# Patient Record
Sex: Female | Born: 1955 | Race: White | Hispanic: No | Marital: Single | Smoking: Never smoker
Health system: Southern US, Community
[De-identification: ages and names within clinical notes are randomized; demographics above are authoritative.]

## PROBLEM LIST (undated history)

## (undated) DIAGNOSIS — R42 Dizziness and giddiness: Secondary | ICD-10-CM

## (undated) DIAGNOSIS — K219 Gastro-esophageal reflux disease without esophagitis: Secondary | ICD-10-CM

## (undated) DIAGNOSIS — Z9889 Other specified postprocedural states: Secondary | ICD-10-CM

## (undated) DIAGNOSIS — T4145XA Adverse effect of unspecified anesthetic, initial encounter: Secondary | ICD-10-CM

## (undated) DIAGNOSIS — Z8709 Personal history of other diseases of the respiratory system: Secondary | ICD-10-CM

## (undated) DIAGNOSIS — J45909 Unspecified asthma, uncomplicated: Secondary | ICD-10-CM

## (undated) DIAGNOSIS — E785 Hyperlipidemia, unspecified: Secondary | ICD-10-CM

## (undated) DIAGNOSIS — K625 Hemorrhage of anus and rectum: Secondary | ICD-10-CM

## (undated) DIAGNOSIS — D649 Anemia, unspecified: Secondary | ICD-10-CM

## (undated) DIAGNOSIS — T8859XA Other complications of anesthesia, initial encounter: Secondary | ICD-10-CM

## (undated) DIAGNOSIS — E079 Disorder of thyroid, unspecified: Secondary | ICD-10-CM

## (undated) DIAGNOSIS — Z9289 Personal history of other medical treatment: Secondary | ICD-10-CM

## (undated) DIAGNOSIS — R112 Nausea with vomiting, unspecified: Secondary | ICD-10-CM

## (undated) HISTORY — PX: CHOLECYSTECTOMY: SHX55

## (undated) HISTORY — DX: Gastro-esophageal reflux disease without esophagitis: K21.9

## (undated) HISTORY — PX: OTHER SURGICAL HISTORY: SHX169

## (undated) HISTORY — DX: Hyperlipidemia, unspecified: E78.5

## (undated) HISTORY — PX: TONSILLECTOMY: SUR1361

---

## 2008-12-16 ENCOUNTER — Ambulatory Visit (HOSPITAL_COMMUNITY): Admission: RE | Admit: 2008-12-16 | Discharge: 2008-12-16 | Payer: Self-pay | Admitting: Specialist

## 2011-01-27 LAB — CBC
Platelets: 245 10*3/uL (ref 150–400)
RDW: 12.7 % (ref 11.5–15.5)
WBC: 6.7 10*3/uL (ref 4.0–10.5)

## 2011-01-27 LAB — BASIC METABOLIC PANEL
BUN: 11 mg/dL (ref 6–23)
Calcium: 9.4 mg/dL (ref 8.4–10.5)
Creatinine, Ser: 0.73 mg/dL (ref 0.4–1.2)
GFR calc non Af Amer: >60 mL/min (ref >60)
Glucose, Bld: 95 mg/dL (ref 70–99)

## 2011-03-01 NOTE — Op Note (Signed)
NAMECHRISMA, HURLOCK                  ACCOUNT NO.:  [id number]   MEDICAL RECORD NO.:  [id number]          PATIENT TYPE:  AMB   LOCATION:  DAY                          FACILITY:  [facility]   PHYSICIAN:  Jene Every, M.D.    DATE OF BIRTH:  Jun 25, 1956   DATE OF PROCEDURE:  12/16/2008  DATE OF DISCHARGE:                               OPERATIVE REPORT   PREOPERATIVE DIAGNOSIS:  Impingement syndrome, acromioclavicular  arthrosis, right shoulder.   POSTOPERATIVE DIAGNOSIS:  Impingement syndrome, acromioclavicular  arthrosis, right shoulder, anterior labral tear.   PROCEDURES PERFORMED:  1. Examination and manipulation under anesthesia, right shoulder.  2. Right shoulder arthroscopy and debridement of anterior labrum.  3. Subacromial decompression, acromioplasty, bursectomy, and resection      of coracoacromial ligament.  4. Mini open distal clavicle resection.   BRIEF HISTORY AND INDICATION:  A 55 year old with refractory shoulder  pain.  Temporary relief from an [facility] joint injection and subacromial  corticosteroid injection.  MRI indicating no tear, severe AC arthrosis,  and impingement.  Operative intervention is indicated for distal  clavicle resection, subacromial decompression, bursectomy, and  evaluation of the humeral joint.  Risks and benefits discussed,  including bleeding, infection, damage to neurovascular structures, no  change in symptoms, worsening symptoms, need for repeat debridement,  etc.   TECHNIQUE:  The patient in supine beach-chair position, after induction  of adequate anesthesia, 1 gram of Kefzol, the right shoulder and upper  extremity were prepped and draped in the usual sterile fashion.  Range  of motion was full noted on exam.   Surgical marker was utilized to delineate the acromion, AC joint, and  coracoid.  Standard posterolateral, anterolateral, and anterior portal  incisions were made through the skin only with a #11 blade.  With the  arm in the 70/30  position with gentle traction, the arthroscopic cannula  was then inserted into the glenohumeral joint, penetrating it  atraumatically in line with the coracoid.  Under direct visualization,  an anterior portal cannula was introduced into the glenohumeral space  under direct visualization beneath the biceps tendon.   Next, examination of the glenohumeral joint revealed normal glenoid and  humerus.  There was an anterior labral tear noted and some synovitis of  the biceps tendon.  A shaver was introduced and utilized to debride the  labrum to a stable base.  There was no detachment of the labrum.  Subscap was unremarkable.  We shaved the undersurface of the biceps  tendon.  No significant tearing was noted.  No tear in the rotator cuff  was noted either.   Next, the anterior cannula was transposed to the lateral portal and the  arthroscopic camera was then inserted into the subacromial space  posteriorly and by triangulation we observed hypertrophic bursa and  bursitis in the subacromial space and synovitis.  We introduced a shaver  and utilized it to perform a full bursectomy.  We then released and  morselized the CA ligament, which improved our subacromial space.  This  was done with an ArthroWand.  We shaved the anterolateral aspect of  the  acromion with a small spur.  We were unable to delivered the clavicle  into the subacromial space due to this significant arthrosis.   Next, full examination of the rotator cuff revealed no evidence of  tearing from full inspection and palpation.  After full bursectomy, we  then removed all arthroscopic equipment and closed the portals with 4-0  nylon simple sutures and injected Marcaine with epinephrine into the  subacromial joint.   Next, a small incision was made over the clavicle.  Subcutaneous tissue  was dissected with cautery as well to achieve hemostasis.  We split the  capsule and the deltotrapezial fascia, skeletonized the distal  clavicle,  and removed the distal centimeter, protecting the soft tissue anteriorly  and posteriorly and the rotator cuff tendon with small retractors.  Following this, we undercut the clavicle with a 3-mm Kerrison.  The  rotator cuff was unremarkable.  We irrigated and debrided the joint.  There was severe arthrosis noted just prior to the resection.  Bone wax  was placed over the distal clavicle.  We irrigated as well and closed  the capsule with 0 Vicryl interrupted figure-of-eight sutures, the  deltotrapezial fascia and the subcutaneous tissue with 2-0 Vicryl simple  sutures.  Skin was reapproximated with 4-0 subcuticular Prolene.  The  clavicle was stable following its distal resection to manipulation.   Next, the wound was dressed sterilely.  She was placed in a sling,  extubated without difficulty, and transported to the recovery room in  satisfactory condition.   The patient tolerated the procedure with no complications.   ASSISTANT:  Roma Schanz, P.A.      Jene Every, M.D.  Electronically Signed     JB/MEDQ  D:  12/16/2008  T:  12/17/2008  Job:  [id number]

## 2013-12-13 ENCOUNTER — Emergency Department (HOSPITAL_COMMUNITY): Payer: Worker's Compensation

## 2013-12-13 ENCOUNTER — Encounter (HOSPITAL_COMMUNITY): Payer: Self-pay | Admitting: Emergency Medicine

## 2013-12-13 ENCOUNTER — Emergency Department (HOSPITAL_COMMUNITY)
Admission: EM | Admit: 2013-12-13 | Discharge: 2013-12-13 | Disposition: A | Discharge status: Home / Self Care | Payer: Worker's Compensation | Attending: Emergency Medicine | Admitting: Emergency Medicine

## 2013-12-13 DIAGNOSIS — Z862 Personal history of diseases of the blood and blood-forming organs and certain disorders involving the immune mechanism: Secondary | ICD-10-CM | POA: Insufficient documentation

## 2013-12-13 DIAGNOSIS — Z8639 Personal history of other endocrine, nutritional and metabolic disease: Secondary | ICD-10-CM | POA: Insufficient documentation

## 2013-12-13 DIAGNOSIS — S4980XA Other specified injuries of shoulder and upper arm, unspecified arm, initial encounter: Secondary | ICD-10-CM | POA: Insufficient documentation

## 2013-12-13 DIAGNOSIS — Y9301 Activity, walking, marching and hiking: Secondary | ICD-10-CM | POA: Diagnosis not present

## 2013-12-13 DIAGNOSIS — S46909A Unspecified injury of unspecified muscle, fascia and tendon at shoulder and upper arm level, unspecified arm, initial encounter: Secondary | ICD-10-CM | POA: Insufficient documentation

## 2013-12-13 DIAGNOSIS — Y929 Unspecified place or not applicable: Secondary | ICD-10-CM | POA: Diagnosis not present

## 2013-12-13 DIAGNOSIS — W010XXA Fall on same level from slipping, tripping and stumbling without subsequent striking against object, initial encounter: Secondary | ICD-10-CM | POA: Diagnosis not present

## 2013-12-13 DIAGNOSIS — W19XXXA Unspecified fall, initial encounter: Secondary | ICD-10-CM

## 2013-12-13 DIAGNOSIS — M25511 Pain in right shoulder: Secondary | ICD-10-CM

## 2013-12-13 HISTORY — DX: Dizziness and giddiness: R42

## 2013-12-13 HISTORY — DX: Disorder of thyroid, unspecified: E07.9

## 2013-12-13 MED ORDER — FENTANYL CITRATE 0.05 MG/ML IJ SOLN
100.0000 ug | Freq: Once | INTRAMUSCULAR | Status: AC
  Administered 2013-12-13: 100 ug via INTRAMUSCULAR
  Filled 2013-12-13: qty 2

## 2013-12-13 MED ORDER — ONDANSETRON 4 MG PO TBDP
8.0000 mg | ORAL_TABLET | Freq: Once | ORAL | Status: AC
  Administered 2013-12-13: 8 mg via ORAL
  Filled 2013-12-13: qty 2

## 2013-12-13 MED ORDER — HYDROCODONE-ACETAMINOPHEN 5-325 MG PO TABS: 2.0000 | ORAL_TABLET | Freq: Four times a day (QID) | ORAL | Status: DC | PRN

## 2013-12-13 MED ORDER — ONDANSETRON HCL 4 MG PO TABS: 4.0000 mg | ORAL_TABLET | Freq: Four times a day (QID) | ORAL | Status: DC

## 2013-12-13 MED ORDER — OXYCODONE-ACETAMINOPHEN 5-325 MG PO TABS: 2.0000 | ORAL_TABLET | Freq: Four times a day (QID) | ORAL | Status: DC | PRN

## 2013-12-13 MED ORDER — TRAMADOL HCL 50 MG PO TABS: 50.0000 mg | ORAL_TABLET | Freq: Four times a day (QID) | ORAL | Status: DC | PRN

## 2013-12-13 NOTE — ED Notes (Signed)
Per pt sts that she fell on some ice this am and is having severe right shoulder pain with very limited movement.

## 2013-12-13 NOTE — Discharge Instructions (Signed)
Fall Prevention and Home Safety Falls cause injuries and can affect all age groups. It is possible to prevent falls.  HOW TO PREVENT FALLS  Wear shoes with rubber soles that do not have an opening for your toes.  Keep the inside and outside of your house well lit.  Use night lights throughout your home.  Remove clutter from floors.  Clean up floor spills.  Remove throw rugs or fasten them to the floor with carpet tape.  Do not place electrical cords across pathways.  Put grab bars by your tub, shower, and toilet. Do not use towel bars as grab bars.  Put handrails on both sides of the stairway. Fix loose handrails.  Do not climb on stools or stepladders, if possible.  Do not wax your floors.  Repair uneven or unsafe sidewalks, walkways, or stairs.  Keep items you use a lot within reach.  Be aware of pets.  Keep emergency numbers next to the telephone.  Put smoke detectors in your home and near bedrooms. Ask your doctor what other things you can do to prevent falls. Document Released: 07/30/2009 Document Revised: 04/03/2012 Document Reviewed: 01/03/2012 [facility] Patient Information 2014 [place], [place].  Shoulder Pain The shoulder is the joint that connects your arm to your body. Muscles and band-like tissues that connect bones to muscles (tendons) hold the joint together. Shoulder pain is felt if an injury or medical problem affects one or more parts of the shoulder. HOME CARE   Put ice on the sore area.  Put ice in a plastic bag.  Place a towel between your skin and the bag.  Leave the ice on for 15-20 minutes, 03-04 times a day for the first 2 days.  Stop using cold packs if they do not help with the pain.  If you were given something to keep your shoulder from moving (sling, shoulder immobilizer), wear it as told. Only take it off to shower or bathe.  Move your arm as little as possible, but keep your hand moving to prevent puffiness (swelling).  Squeeze a  soft ball or foam pad as much as possible to help prevent swelling.  Take medicine as told by your doctor. GET HELP RIGHT AWAY IF:   Your arm, hand, or fingers are numb or tingling.  Your arm, hand, or fingers are puffy (swollen), painful, or turn white or blue.  You have more pain.  You have progressing new pain in your arm, hand, or fingers.  Your hand or fingers get cold.  Your medicine does not help lessen your pain. MAKE SURE YOU:   Understand these instructions.  Will watch your condition.  Will get help right away if you are not doing well or get worse. Document Released: 03/21/2008 Document Revised: 06/27/2012 Document Reviewed: 04/16/2012 [facility] Patient Information 2014 [place], [place].  Shoulder Range of Motion Exercises The shoulder is the most flexible joint in the human body. Because of this it is also the most unstable joint in the body. All ages can develop shoulder problems. Early treatment of problems is necessary for a good outcome. People react to shoulder pain by decreasing the movement of the joint. After a brief period of time, the shoulder can become "frozen". This is an almost complete loss of the ability to move the damaged shoulder. Following injuries your caregivers can give you instructions on exercises to keep your range of motion (ability to move your shoulder freely), or regain it if it has been lost.  EXERCISES EXERCISES TO  MAINTAIN THE MOBILITY OF YOUR SHOULDER: Codman's Exercise or Pendulum Exercise  This exercise may be performed in a prone (face-down) lying position or standing while leaning on a chair with the opposite arm. Its purpose is to relax the muscles in your shoulder and slowly but surely increase the range of motion and to relieve pain.  Lie on your stomach close to the side edge of the bed. Let your weak arm hang over the edge of the bed. Relax your shoulder, arm and hand. Let your shoulder blade relax and drop down.  Slowly and  gently swing your arm forward and back. Do not use your neck muscles; relax them. It might be easier to have someone else gently start swinging your arm.  As pain decreases, increase your swing. To start, arm swing should begin at 15 degree angles. In time and as pain lessens, move to 30-45 degree angles. Start with swinging for about 15 seconds, and work towards swinging for 3 to 5 minutes.  This exercise may also be performed in a standing/bent over position.  Stand and hold onto a sturdy chair with your good arm. Bend forward at the waist and bend your knees slightly to help protect your back. Relax your weak arm, let it hang limp. Relax your shoulder blade and let it drop.  Keep your shoulder relaxed and use body motion to swing your arm in small circles.  Stand up tall and relax.  Repeat motion and change direction of circles.  Start with swinging for about 30 seconds, and work towards swinging for 3 to 5 minutes. STRETCHING EXERCISES:  Lift your arm out in front of you with the elbow bent at 90 degrees. Using your other arm gently pull the elbow forward and across your body.  Bend one arm behind you with the palm facing outward. Using the other arm, hold a towel or rope and reach this arm up above your head, then bend it at the elbow to move your wrist to behind your neck. Grab the free end of the towel with the hand behind your back. Gently pull the towel up with the hand behind your neck, gradually increasing the pull on the hand behind the small of your back. Then, gradually pull down with the hand behind the small of your back. This will pull the hand and arm behind your neck further. Both shoulders will have an increased range of motion with repetition of this exercise. STRENGTHENING EXERCISES:  Standing with your arm at your side and straight out from your shoulder with the elbow bent at 90 degrees, hold onto a small weight and slowly raise your hand so it points straight up in the  air. Repeat this five times to begin with, and gradually increase to ten times. Do this four times per day. As you grow stronger you can gradually increase the weight.  Repeat the above exercise, only this time using an elastic band. Start with your hand up in the air and pull down until your hand is by your side. As you grow stronger, gradually increase the amount you pull by increasing the number or size of the elastic bands. Use the same amount of repetitions.  Standing with your hand at your side and holding onto a weight, gradually lift the hand in front of you until it is over your head. Do the same also with the hand remaining at your side and lift the hand away from your body until it is again over your  head. Repeat this five times to begin with, and gradually increase to ten times. Do this four times per day. As you grow stronger you can gradually increase the weight. Document Released: 07/02/2003 Document Revised: 12/26/2011 Document Reviewed: 10/03/2005 [facility] Patient Information 2014 [place].

## 2013-12-13 NOTE — ED Provider Notes (Signed)
CSN: [id number]     Arrival date & time 12/13/13  1057 History   None    Chief Complaint  Patient presents with  . Shoulder Pain     (Consider location/radiation/quality/duration/timing/severity/associated sxs/prior Treatment) HPI Comments: Patient is a 58 year old female with history of thyroid disease and vertigo who presents today after a fall this morning. She was walking on the ice this morning going to see her first patient when she fell on black ice on an outstretched hand as well as her right shoulder. She is having severe, 10/10 pain. She is reluctant to move her shoulder. She had previous arthroscopy done on this shoulder 4-5 years ago for arthritis. Dr. Tonita Cong did this surgery, but she does not want to go back to see him. She did not hit her head or sustain any other injuries in the fall. She has been ambulatory. No fevers, chills, nausea, vomiting, shortness of breath, chest pain, headache.   The history is provided by the patient. No language interpreter was used.    Past Medical History  Diagnosis Date  . Thyroid disease   . Vertigo    Past Surgical History  Procedure Laterality Date  . Cholecystectomy     History reviewed. No pertinent family history. History  Substance Use Topics  . Smoking status: Never Smoker   . Smokeless tobacco: Not on file  . Alcohol Use: Not on file   OB History   Grav Para Term Preterm Abortions TAB SAB Ect Mult Living                 Review of Systems  Constitutional: Negative for fever and chills.  Respiratory: Negative for shortness of breath.   Cardiovascular: Negative for chest pain.  Gastrointestinal: Negative for nausea, vomiting and abdominal pain.  Musculoskeletal: Positive for arthralgias, joint swelling and myalgias.  Neurological: Negative for headaches.  All other systems reviewed and are negative.      Allergies  Asa; Codeine; and Grapefruit concentrate  Home Medications  No current outpatient prescriptions on  file. BP 178/71  Pulse 62  Temp(Src) 97.8 F (36.6 C)  Resp 18  Ht 5\' 4"  (1.626 m)  Wt 165 lb (74.844 kg)  BMI 28.31 kg/m2  SpO2 96% Physical Exam  Nursing note and vitals reviewed. Constitutional: She is oriented to person, place, and time. She appears well-developed and well-nourished. She does not appear ill. No distress.  HENT:  Head: Normocephalic and atraumatic.  Right Ear: External ear normal.  Left Ear: External ear normal.  Nose: Nose normal.  Mouth/Throat: Oropharynx is clear and moist.  Eyes: Conjunctivae and EOM are normal. Pupils are equal, round, and reactive to light.  Neck: Normal range of motion.  Cardiovascular: Normal rate, regular rhythm, normal heart sounds, intact distal pulses and normal pulses.   Pulses:      Radial pulses are 2+ on the right side, and 2+ on the left side.  Pulmonary/Chest: Effort normal and breath sounds normal. No stridor. No respiratory distress. She has no wheezes. She has no rales.  Abdominal: Soft. She exhibits no distension.  Musculoskeletal: Normal range of motion.  TTP over right shoulder diffusely. No deformity. No bruising. TTP over distal clavicle and AC joint. Neurovascularly intact. Compartment soft.  No anatomical snuff box tenderness.   Neurological: She is alert and oriented to person, place, and time. She has normal strength.  Skin: Skin is warm and dry. She is not diaphoretic. No erythema.  Psychiatric: She has a normal  mood and affect. Her behavior is normal.    ED Course  Procedures (including critical care time) Labs Review Labs Reviewed - No data to display Imaging Review Dg Shoulder Right  12/13/2013   CLINICAL DATA:  Shoulder pain status post fall  EXAM: RIGHT SHOULDER - 2+ VIEW  COMPARISON:  DG SHOULDER 2+V*R* dated 05/22/2008  FINDINGS: Two views of the right shoulder reveal the bones to be adequately mineralized. There is no evidence of an acute fracture nor dislocation. The observed portions of the right  clavicle and upper right ribs appear normal.  IMPRESSION: There is no acute bony abnormality of the right shoulder.   Electronically Signed   By: David  Martinique   On: 12/13/2013 12:02   Dg Hand Complete Right  12/13/2013   CLINICAL DATA:  Right hand numbness status post fall  EXAM: RIGHT HAND - COMPLETE 3+ VIEW  COMPARISON:  None.  FINDINGS: Three views of the right hand reveal the bones to be mildly osteopenic. There is no evidence of an acute fracture nor dislocation. Minimal degenerative interphalangeal joint changes are present. There is mild degenerative change of the first carpometacarpal joint. The distal radius and ulna and the carpal bones are grossly normal.  IMPRESSION: There are mild degenerative changes involving principally the interphalangeal joints and the first carpometacarpal joint. There is no evidence of an acute fracture nor dislocation.   Electronically Signed   By: David  Martinique   On: 12/13/2013 12:03    @NOMUSE @  MDM   Final diagnoses:  Fall  Right shoulder pain    Patient presents to ED with right shoulder pain after [place]. Pain in right shoulder and mild pain in hand. No other injuries. No LOC, confusion, disorientation. XR hand and shoulder are negative for acute pathology. Patient is diffusely tender to palpation over right shoulder. No anatomic snuffbox tenderness. Neurovascularly intact. Compartment soft. Patient placed in a sling for comfort and given work note. She was given orthopedic follow up if pain does not subside. Return instructions given. Vital signs stable for discharge. Patient / Family / Caregiver informed of clinical course, understand medical decision-making process, and agree with plan.     Elwyn Lade, PA-C 12/13/13 1227

## 2013-12-13 NOTE — ED Provider Notes (Signed)
Medical screening examination/treatment/procedure(s) were performed by non-physician practitioner and as supervising physician I was immediately available for consultation/collaboration.  Crisol Muecke L Bobetta Korf, MD 12/13/13 2123 

## 2016-06-17 ENCOUNTER — Ambulatory Visit: Payer: Self-pay | Admitting: Orthopedic Surgery

## 2016-06-22 ENCOUNTER — Encounter (HOSPITAL_COMMUNITY): Payer: Self-pay

## 2016-06-22 ENCOUNTER — Encounter (HOSPITAL_COMMUNITY)
Admission: RE | Admit: 2016-06-22 | Discharge: 2016-06-22 | Disposition: A | Discharge status: Home / Self Care | Payer: BLUE CROSS/BLUE SHIELD | Source: Ambulatory Visit | Attending: Orthopedic Surgery | Admitting: Orthopedic Surgery

## 2016-06-22 ENCOUNTER — Ambulatory Visit: Payer: Self-pay | Admitting: Orthopedic Surgery

## 2016-06-22 DIAGNOSIS — M1712 Unilateral primary osteoarthritis, left knee: Secondary | ICD-10-CM | POA: Insufficient documentation

## 2016-06-22 DIAGNOSIS — Z01812 Encounter for preprocedural laboratory examination: Secondary | ICD-10-CM | POA: Insufficient documentation

## 2016-06-22 HISTORY — DX: Anemia, unspecified: D64.9

## 2016-06-22 HISTORY — DX: Other specified postprocedural states: R11.2

## 2016-06-22 HISTORY — DX: Hemorrhage of anus and rectum: K62.5

## 2016-06-22 HISTORY — DX: Personal history of other diseases of the respiratory system: Z87.09

## 2016-06-22 HISTORY — DX: Personal history of other medical treatment: Z92.89

## 2016-06-22 HISTORY — DX: Other complications of anesthesia, initial encounter: T88.59XA

## 2016-06-22 HISTORY — DX: Other specified postprocedural states: Z98.890

## 2016-06-22 HISTORY — DX: Unspecified asthma, uncomplicated: J45.909

## 2016-06-22 HISTORY — DX: Adverse effect of unspecified anesthetic, initial encounter: T41.45XA

## 2016-06-22 LAB — URINE MICROSCOPIC-ADD ON

## 2016-06-22 LAB — URINALYSIS, ROUTINE W REFLEX MICROSCOPIC
BILIRUBIN URINE: NEGATIVE
GLUCOSE, UA: NEGATIVE mg/dL
KETONES UR: NEGATIVE mg/dL
Leukocytes, UA: NEGATIVE
Nitrite: NEGATIVE
PH: 5 (ref 5.0–8.0)
Protein, ur: NEGATIVE mg/dL
SPECIFIC GRAVITY, URINE: 1.029 (ref 1.005–1.030)

## 2016-06-22 LAB — SURGICAL PCR SCREEN
MRSA, PCR: NEGATIVE
STAPHYLOCOCCUS AUREUS: NEGATIVE

## 2016-06-22 LAB — PROTIME-INR
INR: 0.97
PROTHROMBIN TIME: 12.9 s (ref 11.4–15.2)

## 2016-06-22 LAB — ABO/RH: ABO/RH(D): A POS

## 2016-06-22 LAB — APTT: APTT: 27 s (ref 24–36)

## 2016-06-22 NOTE — Progress Notes (Signed)
CMP and [facility] lab results per chart 06/15/2016 EKG per chart 06/13/2016

## 2016-06-22 NOTE — Patient Instructions (Signed)
Diana Scott  06/22/2016   Your procedure is scheduled on: Wednesday June 29, 2016  Report to [facility]  Entrance take [place]  elevators to 3rd floor to  [place] at 9:45 AM.  Call this number if you have problems the morning of surgery [phone]   Remember: ONLY 1 PERSON MAY GO WITH YOU TO SHORT STAY TO GET  READY MORNING OF [place].  Do not eat food or drink liquids :After Midnight.     Take these medicines the morning of surgery with A SIP OF WATER: Levothyroxine                                You may not have any metal on your body including hair pins and              piercings  Do not wear jewelry, make-up, lotions, powders or perfumes, deodorant             Do not wear nail polish.  Do not shave  48 hours prior to surgery.           Do not bring valuables to the hospital. [place].  Contacts, dentures or bridgework may not be worn into surgery.  Leave suitcase in the car. After surgery it may be brought to your room.                Please read over the following fact sheets you were given:MRSA INFORMATION SHEET; INCENTIVE SPIROMETER; BLOOD TRANSFUSION INFORMATION SHEET  _____________________________________________________________________             [facility] - Preparing for Surgery Before surgery, you can play an important role.  Because skin is not sterile, your skin needs to be as free of germs as possible.  You can reduce the number of germs on your skin by washing with CHG (chlorahexidine gluconate) soap before surgery.  CHG is an antiseptic cleaner which kills germs and bonds with the skin to continue killing germs even after washing. Please DO NOT use if you have an allergy to CHG or antibacterial soaps.  If your skin becomes reddened/irritated stop using the CHG and inform your nurse when you arrive at Short Stay. Do not shave (including legs and underarms) for at  least 48 hours prior to the first CHG shower.  You may shave your face/neck. Please follow these instructions carefully:  1.  Shower with CHG Soap the night before surgery and the  morning of Surgery.  2.  If you choose to wash your hair, wash your hair first as usual with your  normal  shampoo.  3.  After you shampoo, rinse your hair and body thoroughly to remove the  shampoo.                           4.  Use CHG as you would any other liquid soap.  You can apply chg directly  to the skin and wash                       Gently with a scrungie or clean washcloth.  5.  Apply the CHG Soap to  your body ONLY FROM THE NECK DOWN.   Do not use on face/ open                           Wound or open sores. Avoid contact with eyes, ears mouth and genitals (private parts).                       Wash face,  Genitals (private parts) with your normal soap.             6.  Wash thoroughly, paying special attention to the area where your surgery  will be performed.  7.  Thoroughly rinse your body with warm water from the neck down.  8.  DO NOT shower/wash with your normal soap after using and rinsing off  the CHG Soap.                9.  Pat yourself dry with a clean towel.            10.  Wear clean pajamas.            11.  Place clean sheets on your bed the night of your first shower and do not  sleep with pets. Day of Surgery : Do not apply any lotions/deodorants the morning of surgery.  Please wear clean clothes to the hospital/surgery center.  FAILURE TO FOLLOW THESE INSTRUCTIONS MAY RESULT IN THE CANCELLATION OF YOUR SURGERY PATIENT SIGNATURE_________________________________  NURSE SIGNATURE__________________________________  ________________________________________________________________________   Adam Phenix  An incentive spirometer is a tool that can help keep your lungs clear and active. This tool measures how well you are filling your lungs with each breath. Taking long deep breaths  may help reverse or decrease the chance of developing breathing (pulmonary) problems (especially infection) following:  A long period of time when you are unable to move or be active. BEFORE THE PROCEDURE   If the spirometer includes an indicator to show your best effort, your nurse or respiratory therapist will set it to a desired goal.  If possible, sit up straight or lean slightly forward. Try not to slouch.  Hold the incentive spirometer in an upright position. INSTRUCTIONS FOR USE  1. Sit on the edge of your bed if possible, or sit up as far as you can in bed or on a chair. 2. Hold the incentive spirometer in an upright position. 3. Breathe out normally. 4. Place the mouthpiece in your mouth and seal your lips tightly around it. 5. Breathe in slowly and as deeply as possible, raising the piston or the ball toward the top of the column. 6. Hold your breath for 3-5 seconds or for as long as possible. Allow the piston or ball to fall to the bottom of the column. 7. Remove the mouthpiece from your mouth and breathe out normally. 8. Rest for a few seconds and repeat Steps 1 through 7 at least 10 times every 1-2 hours when you are awake. Take your time and take a few normal breaths between deep breaths. 9. The spirometer may include an indicator to show your best effort. Use the indicator as a goal to work toward during each repetition. 10. After each set of 10 deep breaths, practice coughing to be sure your lungs are clear. If you have an incision (the cut made at the time of surgery), support your incision when coughing by placing a pillow or rolled up towels firmly against  it. Once you are able to get out of bed, walk around indoors and cough well. You may stop using the incentive spirometer when instructed by your caregiver.  RISKS AND COMPLICATIONS  Take your time so you do not get dizzy or light-headed.  If you are in pain, you may need to take or ask for pain medication before doing  incentive spirometry. It is harder to take a deep breath if you are having pain. AFTER USE  Rest and breathe slowly and easily.  It can be helpful to keep track of a log of your progress. Your caregiver can provide you with a simple table to help with this. If you are using the spirometer at home, follow these instructions: [place] IF:   You are having difficultly using the spirometer.  You have trouble using the spirometer as often as instructed.  Your pain medication is not giving enough relief while using the spirometer.  You develop fever of 100.5 F (38.1 C) or higher. SEEK IMMEDIATE MEDICAL CARE IF:   You cough up bloody sputum that had not been present before.  You develop fever of 102 F (38.9 C) or greater.  You develop worsening pain at or near the incision site. MAKE SURE YOU:   Understand these instructions.  Will watch your condition.  Will get help right away if you are not doing well or get worse. Document Released: 02/13/2007 Document Revised: 12/26/2011 Document Reviewed: 04/16/2007 ExitCare Patient Information 2014 Exit[employer], [place].   ________________________________________________________________________  WHAT IS A BLOOD TRANSFUSION? Blood Transfusion Information  A transfusion is the replacement of blood or some of its parts. Blood is made up of multiple cells which provide different functions.  Red blood cells carry oxygen and are used for blood loss replacement.  White blood cells fight against infection.  Platelets control bleeding.  Plasma helps clot blood.  Other blood products are available for specialized needs, such as hemophilia or other clotting disorders. BEFORE THE TRANSFUSION  Who gives blood for transfusions?   Healthy volunteers who are fully evaluated to make sure their blood is safe. This is blood bank blood. Transfusion therapy is the safest it has ever been in the practice of medicine. Before blood is taken from a  donor, a complete history is taken to make sure that person has no history of diseases nor engages in risky social behavior (examples are intravenous drug use or sexual activity with multiple partners). The donor's travel history is screened to minimize risk of transmitting infections, such as malaria. The donated blood is tested for signs of infectious diseases, such as HIV and hepatitis. The blood is then tested to be sure it is compatible with you in order to minimize the chance of a transfusion reaction. If you or a relative donates blood, this is often done in anticipation of surgery and is not appropriate for emergency situations. It takes many days to process the donated blood. RISKS AND COMPLICATIONS Although transfusion therapy is very safe and saves many lives, the main dangers of transfusion include:   Getting an infectious disease.  Developing a transfusion reaction. This is an allergic reaction to something in the blood you were given. Every precaution is taken to prevent this. The decision to have a blood transfusion has been considered carefully by your caregiver before blood is given. Blood is not given unless the benefits outweigh the risks. AFTER THE TRANSFUSION  Right after receiving a blood transfusion, you will usually feel much better and more  energetic. This is especially true if your red blood cells have gotten low (anemic). The transfusion raises the level of the red blood cells which carry oxygen, and this usually causes an energy increase.  The nurse administering the transfusion will monitor you carefully for complications. HOME CARE INSTRUCTIONS  No special instructions are needed after a transfusion. You may find your energy is better. Speak with your caregiver about any limitations on activity for underlying diseases you may have. SEEK MEDICAL CARE IF:   Your condition is not improving after your transfusion.  You develop redness or irritation at the intravenous (IV)  site. SEEK IMMEDIATE MEDICAL CARE IF:  Any of the following symptoms occur over the next 12 hours:  Shaking chills.  You have a temperature by mouth above 102 F (38.9 C), not controlled by medicine.  Chest, back, or muscle pain.  People around you feel you are not acting correctly or are confused.  Shortness of breath or difficulty breathing.  Dizziness and fainting.  You get a rash or develop hives.  You have a decrease in urine output.  Your urine turns a dark color or changes to pink, red, or brown. Any of the following symptoms occur over the next 10 days:  You have a temperature by mouth above 102 F (38.9 C), not controlled by medicine.  Shortness of breath.  Weakness after normal activity.  The white part of the eye turns yellow (jaundice).  You have a decrease in the amount of urine or are urinating less often.  Your urine turns a dark color or changes to pink, red, or brown. Document Released: 09/30/2000 Document Revised: 12/26/2011 Document Reviewed: 05/19/2008 [facility] Patient Information 2014 [place], [place].  _______________________________________________________________________

## 2016-06-22 NOTE — Progress Notes (Signed)
Urinalysis and micro results in epic per PAT visit 06/22/2016 sent to Dr Wynelle Link

## 2016-06-27 NOTE — H&P (Signed)
TOTAL KNEE ADMISSION H&P  Patient is being admitted for left total knee arthroplasty.  Subjective:  Chief Complaint:left knee pain.  HPI: Diana Scott, 60 y.o. female, has a history of pain and functional disability in the left knee due to arthritis and has failed non-surgical conservative treatments for greater than 12 weeks to includeNSAID's and/or analgesics, corticosteriod injections, flexibility and strengthening excercises and activity modification.  Onset of symptoms was gradual, starting 4 years ago with gradually worsening course since that time. The patient noted prior procedures on the knee to include  arthroscopy and menisectomy on the left knee(s).  Patient currently rates pain in the left knee(s) at 8 out of 10 with activity. Patient has night pain, worsening of pain with activity and weight bearing, pain that interferes with activities of daily living, pain with passive range of motion, crepitus and joint swelling.  Patient has evidence of periarticular osteophytes and joint space narrowing by imaging studies. There is no active infection.   Past Medical History:  Diagnosis Date  . Anemia   . Asthma    childhood   . Complication of anesthesia    sensitive to anesthesia; vertigo episode after anesthesia; nausea and vomiting   . History of blood transfusion   . History of bronchitis   . PONV (postoperative nausea and vomiting)   . Rectal bleeding   . Thyroid disease   . Vertigo     Past Surgical History:  Procedure Laterality Date  . CHOLECYSTECTOMY    . colonscopy      polyp removed   . TONSILLECTOMY        Current Outpatient Prescriptions:  .  CRESTOR 10 MG tablet, Take 5 mg by mouth See admin instructions. Pt takes for 3 days and 3 days off. This is to help tolerate med., Disp: , Rfl:  .  ibandronate (BONIVA) 150 MG tablet, Take 1 tablet by mouth every 30 (thirty) days., Disp: , Rfl:  .  levothyroxine (SYNTHROID, LEVOTHROID) 88 MCG tablet, Take 88 mcg by mouth  daily., Disp: , Rfl:  .  meloxicam (MOBIC) 15 MG tablet, Take 7.5 mg by mouth daily as needed for pain. , Disp: , Rfl:  .  Omega-3 Fatty Acids (FISH OIL) 1000 MG CAPS, Take 1 capsule by mouth daily., Disp: , Rfl:  .  pyridoxine (B-6) 100 MG tablet, Take 100 mg by mouth daily., Disp: , Rfl:  .  vitamin B-12 (CYANOCOBALAMIN) 500 MCG tablet, Take 1,000 mcg by mouth daily., Disp: , Rfl:  .  vitamin C (ASCORBIC ACID) 500 MG tablet, Take 500 mg by mouth daily., Disp: , Rfl:   Allergies  Allergen Reactions  . Oxycodone Nausea Only  . Asa [Aspirin] Other (See Comments)    Reaction unknown  . Codeine Other (See Comments)    Reaction unknown Hydrocodone, Oxycodone   . Grapefruit Concentrate Other (See Comments)    Reaction unknown    Social History  Substance Use Topics  . Smoking status: Never Smoker  . Smokeless tobacco: Never Used  . Alcohol use No      Review of Systems  Constitutional: Negative.   HENT: Negative.   Respiratory: Negative.   Cardiovascular: Positive for orthopnea. Negative for chest pain, palpitations, claudication, leg swelling and PND.  Gastrointestinal: Negative.   Genitourinary: Negative.   Musculoskeletal: Positive for joint pain and myalgias. Negative for back pain, falls and neck pain.  Skin: Negative.   Neurological: Negative.   Endo/Heme/Allergies: Negative.   Psychiatric/Behavioral: Negative.  Objective:  Physical Exam  Constitutional: She is oriented to person, place, and time. She appears well-developed. No distress.  Overweight  HENT:  Head: Normocephalic and atraumatic.  Right Ear: External ear normal.  Left Ear: External ear normal.  Nose: Nose normal.  Mouth/Throat: Oropharynx is clear and moist.  Eyes: Conjunctivae and EOM are normal.  Neck: Normal range of motion. Neck supple.  Cardiovascular: Normal rate, regular rhythm, normal heart sounds and intact distal pulses.   No murmur heard. Respiratory: Breath sounds normal. No  respiratory distress. She has no wheezes.  GI: Soft. Bowel sounds are normal. She exhibits no distension. There is no tenderness.  Musculoskeletal:       Right hip: Normal.       Left hip: Normal.       Right knee: Normal.       Left knee: She exhibits decreased range of motion and swelling. She exhibits no effusion and no erythema. Tenderness found. Medial joint line and lateral joint line tenderness noted.  Both hips showed normal range of motion with no discomfort. Her right knee shows no effusion. Range of motion in right knee is about 0 to 135. Minimal crepitus with range of motion. No tenderness or instability. Left knee, trace effusion. Range about 0 to 125. Marked crepitus on range of motion, tenderness medial greater than lateral with no instability noted. There is no deformity. She does have an antalgic gait pattern on the left.  Neurological: She is alert and oriented to person, place, and time. She has normal strength. No sensory deficit.  Skin: No rash noted. She is not diaphoretic. No erythema.  Psychiatric: She has a normal mood and affect. Her behavior is normal.     Vitals  Weight: 161 lb Height: 63in Body Surface Area: 1.76 m Body Mass Index: 28.52 kg/m  Pulse: 76 (Regular)  BP: 142/84 (Sitting, Left Arm, Standard)   Imaging Review Plain radiographs demonstrate severe degenerative joint disease of the left knee(s). The overall alignment ismild varus. The bone quality appears to be good for age and reported activity level.  Assessment/Plan:  End stage primary osteoarthritis, left knee   The patient history, physical examination, clinical judgment of the provider and imaging studies are consistent with end stage degenerative joint disease of the left knee(s) and total knee arthroplasty is deemed medically necessary. The treatment options including medical management, injection therapy arthroscopy and arthroplasty were discussed at length. The risks and benefits  of total knee arthroplasty were presented and reviewed. The risks due to aseptic loosening, infection, stiffness, patella tracking problems, thromboembolic complications and other imponderables were discussed. The patient acknowledged the explanation, agreed to proceed with the plan and consent was signed. Patient is being admitted for inpatient treatment for surgery, pain control, PT, OT, prophylactic antibiotics, VTE prophylaxis, progressive ambulation and ADL's and discharge planning. The patient is planning to be discharged home with home health services   PCP: Dr. Kathlen Mody Therapy Plans: HHPT then outpatient therapy Home with boyfriend    Ardeen Jourdain, [place]

## 2016-06-29 ENCOUNTER — Inpatient Hospital Stay (HOSPITAL_COMMUNITY)
Admission: RE | Admit: 2016-06-29 | Discharge: 2016-06-30 | DRG: 470 | Disposition: A | Discharge status: Home Health | Payer: BLUE CROSS/BLUE SHIELD | Source: Ambulatory Visit | Attending: Orthopedic Surgery | Admitting: Orthopedic Surgery

## 2016-06-29 ENCOUNTER — Inpatient Hospital Stay (HOSPITAL_COMMUNITY): Payer: BLUE CROSS/BLUE SHIELD | Admitting: Anesthesiology

## 2016-06-29 ENCOUNTER — Encounter (HOSPITAL_COMMUNITY)
Admission: RE | Disposition: A | Discharge status: Home Health | Payer: Self-pay | Source: Ambulatory Visit | Attending: Orthopedic Surgery

## 2016-06-29 ENCOUNTER — Encounter (HOSPITAL_COMMUNITY): Payer: Self-pay | Admitting: *Deleted

## 2016-06-29 DIAGNOSIS — Z885 Allergy status to narcotic agent status: Secondary | ICD-10-CM

## 2016-06-29 DIAGNOSIS — Z91018 Allergy to other foods: Secondary | ICD-10-CM | POA: Diagnosis not present

## 2016-06-29 DIAGNOSIS — Z791 Long term (current) use of non-steroidal anti-inflammatories (NSAID): Secondary | ICD-10-CM | POA: Diagnosis not present

## 2016-06-29 DIAGNOSIS — M25562 Pain in left knee: Secondary | ICD-10-CM | POA: Diagnosis present

## 2016-06-29 DIAGNOSIS — M179 Osteoarthritis of knee, unspecified: Secondary | ICD-10-CM | POA: Diagnosis present

## 2016-06-29 DIAGNOSIS — M1710 Unilateral primary osteoarthritis, unspecified knee: Secondary | ICD-10-CM | POA: Diagnosis present

## 2016-06-29 DIAGNOSIS — Z886 Allergy status to analgesic agent status: Secondary | ICD-10-CM

## 2016-06-29 DIAGNOSIS — M25762 Osteophyte, left knee: Secondary | ICD-10-CM | POA: Diagnosis present

## 2016-06-29 DIAGNOSIS — Z79899 Other long term (current) drug therapy: Secondary | ICD-10-CM

## 2016-06-29 DIAGNOSIS — M1712 Unilateral primary osteoarthritis, left knee: Principal | ICD-10-CM | POA: Diagnosis present

## 2016-06-29 HISTORY — PX: TOTAL KNEE ARTHROPLASTY: SHX125

## 2016-06-29 LAB — TYPE AND SCREEN
ABO/RH(D): A POS
ANTIBODY SCREEN: NEGATIVE

## 2016-06-29 SURGERY — ARTHROPLASTY, KNEE, TOTAL
Anesthesia: General | Site: Hip | Laterality: Left

## 2016-06-29 MED ORDER — TRANEXAMIC ACID 1000 MG/10ML IV SOLN
1000.0000 mg | INTRAVENOUS | Status: AC
  Administered 2016-06-29: 1000 mg via INTRAVENOUS
  Filled 2016-06-29: qty 1100

## 2016-06-29 MED ORDER — ACETAMINOPHEN 10 MG/ML IV SOLN
INTRAVENOUS | Status: AC
  Filled 2016-06-29: qty 100

## 2016-06-29 MED ORDER — LACTATED RINGERS IV SOLN
INTRAVENOUS | Status: DC
  Administered 2016-06-29 (×2): via INTRAVENOUS

## 2016-06-29 MED ORDER — PROPOFOL 10 MG/ML IV BOLUS
INTRAVENOUS | Status: AC
  Filled 2016-06-29: qty 40

## 2016-06-29 MED ORDER — FLEET ENEMA 7-19 GM/118ML RE ENEM: 1.0000 | ENEMA | Freq: Once | RECTAL | Status: DC | PRN

## 2016-06-29 MED ORDER — DEXAMETHASONE SODIUM PHOSPHATE 10 MG/ML IJ SOLN
10.0000 mg | Freq: Once | INTRAMUSCULAR | Status: AC
  Administered 2016-06-29: 10 mg via INTRAVENOUS

## 2016-06-29 MED ORDER — BUPIVACAINE IN DEXTROSE 0.75-8.25 % IT SOLN
INTRATHECAL | Status: DC | PRN
  Administered 2016-06-29: 2 mL via INTRATHECAL

## 2016-06-29 MED ORDER — HYDROMORPHONE HCL 1 MG/ML IJ SOLN
INTRAMUSCULAR | Status: AC
  Administered 2016-06-29: 0.5 mg via INTRAVENOUS
  Filled 2016-06-29: qty 1

## 2016-06-29 MED ORDER — METOCLOPRAMIDE HCL 5 MG PO TABS
5.0000 mg | ORAL_TABLET | Freq: Three times a day (TID) | ORAL | Status: DC | PRN
  Administered 2016-06-30: 5 mg via ORAL
  Filled 2016-06-29: qty 1

## 2016-06-29 MED ORDER — TRAMADOL HCL 50 MG PO TABS: 50.0000 mg | ORAL_TABLET | Freq: Four times a day (QID) | ORAL | Status: DC | PRN

## 2016-06-29 MED ORDER — STERILE WATER FOR IRRIGATION IR SOLN
Status: DC | PRN
  Administered 2016-06-29: 2000 mL

## 2016-06-29 MED ORDER — ONDANSETRON HCL 4 MG/2ML IJ SOLN
INTRAMUSCULAR | Status: AC
  Filled 2016-06-29: qty 2

## 2016-06-29 MED ORDER — FENTANYL CITRATE (PF) 100 MCG/2ML IJ SOLN
INTRAMUSCULAR | Status: AC
  Filled 2016-06-29: qty 2

## 2016-06-29 MED ORDER — BUPIVACAINE HCL (PF) 0.25 % IJ SOLN
INTRAMUSCULAR | Status: AC
  Filled 2016-06-29: qty 30

## 2016-06-29 MED ORDER — MEPERIDINE HCL 50 MG/ML IJ SOLN: 6.2500 mg | INTRAMUSCULAR | Status: DC | PRN

## 2016-06-29 MED ORDER — METOCLOPRAMIDE HCL 5 MG/ML IJ SOLN
5.0000 mg | Freq: Three times a day (TID) | INTRAMUSCULAR | Status: DC | PRN
  Administered 2016-06-29: 10 mg via INTRAVENOUS
  Filled 2016-06-29: qty 2

## 2016-06-29 MED ORDER — LEVOTHYROXINE SODIUM 88 MCG PO TABS
88.0000 ug | ORAL_TABLET | Freq: Every day | ORAL | Status: DC
  Administered 2016-06-30: 88 ug via ORAL
  Filled 2016-06-29: qty 1

## 2016-06-29 MED ORDER — ACETAMINOPHEN 10 MG/ML IV SOLN
1000.0000 mg | Freq: Once | INTRAVENOUS | Status: AC
  Administered 2016-06-29: 1000 mg via INTRAVENOUS
  Filled 2016-06-29: qty 100

## 2016-06-29 MED ORDER — ONDANSETRON HCL 4 MG/2ML IJ SOLN
4.0000 mg | Freq: Four times a day (QID) | INTRAMUSCULAR | Status: DC | PRN
  Filled 2016-06-29: qty 2

## 2016-06-29 MED ORDER — DOCUSATE SODIUM 100 MG PO CAPS
100.0000 mg | ORAL_CAPSULE | Freq: Two times a day (BID) | ORAL | Status: DC
  Administered 2016-06-29 – 2016-06-30 (×2): 100 mg via ORAL
  Filled 2016-06-29 (×3): qty 1

## 2016-06-29 MED ORDER — ONDANSETRON HCL 4 MG/2ML IJ SOLN
INTRAMUSCULAR | Status: DC | PRN
  Administered 2016-06-29: 4 mg via INTRAVENOUS

## 2016-06-29 MED ORDER — ACETAMINOPHEN 325 MG PO TABS: 650.0000 mg | ORAL_TABLET | Freq: Four times a day (QID) | ORAL | Status: DC | PRN

## 2016-06-29 MED ORDER — METHOCARBAMOL 500 MG PO TABS
500.0000 mg | ORAL_TABLET | Freq: Four times a day (QID) | ORAL | Status: DC | PRN
  Administered 2016-06-29: 500 mg via ORAL
  Filled 2016-06-29 (×3): qty 1

## 2016-06-29 MED ORDER — PROPOFOL 500 MG/50ML IV EMUL
INTRAVENOUS | Status: DC | PRN
  Administered 2016-06-29: 100 ug/kg/min via INTRAVENOUS

## 2016-06-29 MED ORDER — HYDROMORPHONE HCL 2 MG PO TABS
2.0000 mg | ORAL_TABLET | ORAL | Status: DC | PRN
  Administered 2016-06-30 (×2): 2 mg via ORAL
  Filled 2016-06-29: qty 2
  Filled 2016-06-29 (×2): qty 1

## 2016-06-29 MED ORDER — LACTATED RINGERS IV SOLN: INTRAVENOUS | Status: DC

## 2016-06-29 MED ORDER — CHLORHEXIDINE GLUCONATE 4 % EX LIQD
60.0000 mL | Freq: Once | CUTANEOUS | Status: DC
Start: 2016-06-29 — End: 2016-06-29

## 2016-06-29 MED ORDER — TRANEXAMIC ACID 1000 MG/10ML IV SOLN
1000.0000 mg | Freq: Once | INTRAVENOUS | Status: AC
  Administered 2016-06-29: 1000 mg via INTRAVENOUS
  Filled 2016-06-29: qty 10

## 2016-06-29 MED ORDER — MIDAZOLAM HCL 2 MG/2ML IJ SOLN
INTRAMUSCULAR | Status: AC
  Filled 2016-06-29: qty 2

## 2016-06-29 MED ORDER — PROPOFOL 10 MG/ML IV BOLUS
INTRAVENOUS | Status: DC | PRN
  Administered 2016-06-29: 140 mg via INTRAVENOUS

## 2016-06-29 MED ORDER — ACETAMINOPHEN 650 MG RE SUPP: 650.0000 mg | Freq: Four times a day (QID) | RECTAL | Status: DC | PRN

## 2016-06-29 MED ORDER — HYDROMORPHONE HCL 1 MG/ML IJ SOLN
0.2500 mg | INTRAMUSCULAR | Status: DC | PRN
  Administered 2016-06-29: 0.25 mg via INTRAVENOUS
  Administered 2016-06-29: 0.5 mg via INTRAVENOUS

## 2016-06-29 MED ORDER — METHOCARBAMOL 1000 MG/10ML IJ SOLN
500.0000 mg | Freq: Four times a day (QID) | INTRAMUSCULAR | Status: DC | PRN
  Administered 2016-06-29: 500 mg via INTRAVENOUS
  Filled 2016-06-29: qty 5
  Filled 2016-06-29: qty 550

## 2016-06-29 MED ORDER — ONDANSETRON HCL 4 MG/2ML IJ SOLN: 4.0000 mg | Freq: Once | INTRAMUSCULAR | Status: DC | PRN

## 2016-06-29 MED ORDER — SODIUM CHLORIDE 0.9 % IJ SOLN
INTRAMUSCULAR | Status: DC | PRN
  Administered 2016-06-29: 30 mL

## 2016-06-29 MED ORDER — ONDANSETRON HCL 4 MG PO TABS
4.0000 mg | ORAL_TABLET | Freq: Four times a day (QID) | ORAL | Status: DC | PRN
  Administered 2016-06-30 (×2): 4 mg via ORAL
  Filled 2016-06-29 (×2): qty 1

## 2016-06-29 MED ORDER — DEXAMETHASONE SODIUM PHOSPHATE 10 MG/ML IJ SOLN
INTRAMUSCULAR | Status: AC
  Filled 2016-06-29: qty 1

## 2016-06-29 MED ORDER — MENTHOL 3 MG MT LOZG: 1.0000 | LOZENGE | OROMUCOSAL | Status: DC | PRN

## 2016-06-29 MED ORDER — PHENOL 1.4 % MT LIQD
1.0000 | OROMUCOSAL | Status: DC | PRN
  Filled 2016-06-29: qty 177

## 2016-06-29 MED ORDER — RIVAROXABAN 10 MG PO TABS
10.0000 mg | ORAL_TABLET | Freq: Every day | ORAL | Status: DC
  Administered 2016-06-30: 10 mg via ORAL
  Filled 2016-06-29: qty 1

## 2016-06-29 MED ORDER — SODIUM CHLORIDE 0.9 % IR SOLN
Status: DC | PRN
  Administered 2016-06-29: 1000 mL

## 2016-06-29 MED ORDER — BISACODYL 10 MG RE SUPP: 10.0000 mg | Freq: Every day | RECTAL | Status: DC | PRN

## 2016-06-29 MED ORDER — FENTANYL CITRATE (PF) 100 MCG/2ML IJ SOLN
INTRAMUSCULAR | Status: DC | PRN
  Administered 2016-06-29: 100 ug via INTRAVENOUS

## 2016-06-29 MED ORDER — CEFAZOLIN SODIUM-DEXTROSE 2-4 GM/100ML-% IV SOLN
2.0000 g | INTRAVENOUS | Status: AC
  Administered 2016-06-29: 2 g via INTRAVENOUS
  Filled 2016-06-29: qty 100

## 2016-06-29 MED ORDER — DEXAMETHASONE SODIUM PHOSPHATE 10 MG/ML IJ SOLN
10.0000 mg | Freq: Once | INTRAMUSCULAR | Status: AC
  Administered 2016-06-30: 10 mg via INTRAVENOUS
  Filled 2016-06-29: qty 1

## 2016-06-29 MED ORDER — CEFAZOLIN SODIUM-DEXTROSE 2-4 GM/100ML-% IV SOLN
INTRAVENOUS | Status: AC
  Filled 2016-06-29: qty 100

## 2016-06-29 MED ORDER — POLYETHYLENE GLYCOL 3350 17 G PO PACK: 17.0000 g | PACK | Freq: Every day | ORAL | Status: DC | PRN

## 2016-06-29 MED ORDER — BUPIVACAINE LIPOSOME 1.3 % IJ SUSP
INTRAMUSCULAR | Status: DC | PRN
  Administered 2016-06-29: 20 mL

## 2016-06-29 MED ORDER — ACETAMINOPHEN 500 MG PO TABS
1000.0000 mg | ORAL_TABLET | Freq: Four times a day (QID) | ORAL | Status: DC
  Administered 2016-06-29: 500 mg via ORAL
  Administered 2016-06-30 (×2): 1000 mg via ORAL
  Filled 2016-06-29 (×4): qty 2

## 2016-06-29 MED ORDER — CEFAZOLIN SODIUM-DEXTROSE 2-4 GM/100ML-% IV SOLN
2.0000 g | Freq: Four times a day (QID) | INTRAVENOUS | Status: AC
  Administered 2016-06-29 – 2016-06-30 (×2): 2 g via INTRAVENOUS
  Filled 2016-06-29 (×2): qty 100

## 2016-06-29 MED ORDER — DIPHENHYDRAMINE HCL 12.5 MG/5ML PO ELIX: 12.5000 mg | ORAL_SOLUTION | ORAL | Status: DC | PRN

## 2016-06-29 MED ORDER — SODIUM CHLORIDE 0.9 % IV SOLN
INTRAVENOUS | Status: DC
  Administered 2016-06-29 – 2016-06-30 (×2): via INTRAVENOUS

## 2016-06-29 MED ORDER — HYDROMORPHONE HCL 1 MG/ML IJ SOLN: 0.5000 mg | INTRAMUSCULAR | Status: DC | PRN

## 2016-06-29 MED ORDER — CHLORHEXIDINE GLUCONATE 4 % EX LIQD: 60.0000 mL | Freq: Once | CUTANEOUS | Status: DC

## 2016-06-29 MED ORDER — SODIUM CHLORIDE 0.9 % IJ SOLN
INTRAMUSCULAR | Status: AC
  Filled 2016-06-29: qty 50

## 2016-06-29 MED ORDER — MIDAZOLAM HCL 5 MG/5ML IJ SOLN
INTRAMUSCULAR | Status: DC | PRN
  Administered 2016-06-29: 2 mg via INTRAVENOUS

## 2016-06-29 MED ORDER — BUPIVACAINE LIPOSOME 1.3 % IJ SUSP
20.0000 mL | Freq: Once | INTRAMUSCULAR | Status: DC
  Filled 2016-06-29: qty 20

## 2016-06-29 MED ORDER — BUPIVACAINE HCL 0.25 % IJ SOLN
INTRAMUSCULAR | Status: DC | PRN
  Administered 2016-06-29: 20 mL

## 2016-06-29 MED ORDER — 0.9 % SODIUM CHLORIDE (POUR BTL) OPTIME
TOPICAL | Status: DC | PRN
  Administered 2016-06-29: 1000 mL

## 2016-06-29 SURGICAL SUPPLY — 51 items
BAG ZIPLOCK 12X15 (MISCELLANEOUS) ×3 IMPLANT
BANDAGE ACE 4X5 VEL STRL LF (GAUZE/BANDAGES/DRESSINGS) ×3 IMPLANT
BANDAGE ACE 6X5 VEL STRL LF (GAUZE/BANDAGES/DRESSINGS) ×3 IMPLANT
BLADE SAG 18X100X1.27 (BLADE) ×3 IMPLANT
BLADE SAW SGTL 11.0X1.19X90.0M (BLADE) ×3 IMPLANT
BOWL SMART MIX CTS (DISPOSABLE) ×3 IMPLANT
CAPT KNEE TOTAL 3 ATTUNE ×3 IMPLANT
CEMENT HV SMART SET (Cement) ×6 IMPLANT
CLOSURE WOUND 1/2 X4 (GAUZE/BANDAGES/DRESSINGS) ×2
CLOTH BEACON ORANGE TIMEOUT ST (SAFETY) ×3 IMPLANT
CUFF TOURN SGL QUICK 34 (TOURNIQUET CUFF) ×2
CUFF TRNQT CYL 34X4X40X1 (TOURNIQUET CUFF) ×1 IMPLANT
DECANTER SPIKE VIAL GLASS SM (MISCELLANEOUS) ×3 IMPLANT
DRAPE U-SHAPE 47X51 STRL (DRAPES) ×3 IMPLANT
DRSG ADAPTIC 3X8 NADH LF (GAUZE/BANDAGES/DRESSINGS) ×3 IMPLANT
DURAPREP 26ML APPLICATOR (WOUND CARE) ×3 IMPLANT
ELECT REM PT RETURN 9FT ADLT (ELECTROSURGICAL) ×3
ELECTRODE REM PT RTRN 9FT ADLT (ELECTROSURGICAL) ×1 IMPLANT
EVACUATOR 1/8 PVC DRAIN (DRAIN) ×3 IMPLANT
GAUZE SPONGE 4X4 12PLY STRL (GAUZE/BANDAGES/DRESSINGS) ×3 IMPLANT
GLOVE BIO SURGEON STRL SZ 6.5 (GLOVE) ×2 IMPLANT
GLOVE BIO SURGEON STRL SZ7.5 (GLOVE) ×6 IMPLANT
GLOVE BIO SURGEON STRL SZ8 (GLOVE) ×6 IMPLANT
GLOVE BIO SURGEONS STRL SZ 6.5 (GLOVE) ×1
GLOVE BIOGEL PI IND STRL 6.5 (GLOVE) ×1 IMPLANT
GLOVE BIOGEL PI IND STRL 7.0 (GLOVE) ×2 IMPLANT
GLOVE BIOGEL PI IND STRL 7.5 (GLOVE) ×1 IMPLANT
GLOVE BIOGEL PI IND STRL 8 (GLOVE) ×2 IMPLANT
GLOVE BIOGEL PI INDICATOR 6.5 (GLOVE) ×2
GLOVE BIOGEL PI INDICATOR 7.0 (GLOVE) ×4
GLOVE BIOGEL PI INDICATOR 7.5 (GLOVE) ×2
GLOVE BIOGEL PI INDICATOR 8 (GLOVE) ×4
GOWN STRL REUS W/TWL LRG LVL3 (GOWN DISPOSABLE) ×9 IMPLANT
GOWN STRL REUS W/TWL XL LVL3 (GOWN DISPOSABLE) ×3 IMPLANT
HANDPIECE INTERPULSE COAX TIP (DISPOSABLE) ×2
IMMOBILIZER KNEE 20 (SOFTGOODS) ×3
IMMOBILIZER KNEE 20 THIGH 36 (SOFTGOODS) ×1 IMPLANT
MANIFOLD NEPTUNE II (INSTRUMENTS) ×3 IMPLANT
PACK TOTAL KNEE CUSTOM (KITS) ×3 IMPLANT
PAD ABD 8X10 STRL (GAUZE/BANDAGES/DRESSINGS) ×3 IMPLANT
PADDING CAST COTTON 6X4 STRL (CAST SUPPLIES) ×3 IMPLANT
POSITIONER SURGICAL ARM (MISCELLANEOUS) ×3 IMPLANT
SET HNDPC FAN SPRY TIP SCT (DISPOSABLE) ×1 IMPLANT
STRIP CLOSURE SKIN 1/2X4 (GAUZE/BANDAGES/DRESSINGS) ×4 IMPLANT
SUT MNCRL AB 4-0 PS2 18 (SUTURE) ×3 IMPLANT
SUT VIC AB 2-0 CT1 27 (SUTURE) ×6
SUT VIC AB 2-0 CT1 TAPERPNT 27 (SUTURE) ×3 IMPLANT
SUT VLOC 180 0 24IN GS25 (SUTURE) ×3 IMPLANT
TRAY FOLEY CATH SILVER 14FR (SET/KITS/TRAYS/PACK) ×3 IMPLANT
WRAP KNEE MAXI GEL POST OP (GAUZE/BANDAGES/DRESSINGS) ×3 IMPLANT
YANKAUER SUCT BULB TIP 10FT TU (MISCELLANEOUS) ×3 IMPLANT

## 2016-06-29 NOTE — Interval H&P Note (Signed)
History and Physical Interval Note:  06/29/2016 12:20 PM  Diana Scott  has presented today for surgery, with the diagnosis of LEFT KNEE OA  The various methods of treatment have been discussed with the patient and family. After consideration of risks, benefits and other options for treatment, the patient has consented to  Procedure(s): LEFT TOTAL KNEE ARTHROPLASTY (Left) as a surgical intervention .  The patient's history has been reviewed, patient examined, no change in status, stable for surgery.  I have reviewed the patient's chart and labs.  Questions were answered to the patient's satisfaction.     Gearlean Alf

## 2016-06-29 NOTE — Anesthesia Procedure Notes (Signed)
Spinal  Start time: 06/29/2016 12:18 PM End time: 06/29/2016 12:22 PM Staffing Anesthesiologist: Lillia Abed Performed: anesthesiologist  Preanesthetic Checklist Completed: patient identified, surgical consent, pre-op evaluation, timeout performed, IV checked, risks and benefits discussed and monitors and equipment checked Spinal Block Patient position: sitting Prep: Betadine Patient monitoring: heart rate, cardiac monitor, continuous pulse ox and blood pressure Approach: right paramedian Location: L3-4 Injection technique: single-shot Needle Needle type: Pencan  Needle gauge: 24 G Needle length: 9 cm Needle insertion depth: 6 cm

## 2016-06-29 NOTE — Op Note (Signed)
OPERATIVE REPORT-TOTAL KNEE ARTHROPLASTY   Pre-operative diagnosis- Osteoarthritis  Left knee(s)  Post-operative diagnosis- Osteoarthritis Left knee(s)  Procedure-  Left  Total Knee Arthroplasty  Surgeon- Dione Plover. Soledad Budreau, MD  Assistant- Arlee Muslim, PA-C   Anesthesia-  Spinal  EBL-* No blood loss amount entered *   Drains Hemovac  Tourniquet time-  Total Tourniquet Time Documented: Thigh (Left) - 37 minutes Total: Thigh (Left) - 37 minutes     Complications- None  Condition-PACU - hemodynamically stable.   Brief Clinical Note  Diana Scott is a 60 y.o. year old female with end stage OA of her left knee with progressively worsening pain and dysfunction. She has constant pain, with activity and at rest and significant functional deficits with difficulties even with ADLs. She has had extensive non-op management including analgesics, injections of cortisone and viscosupplements, and home exercise program, but remains in significant pain with significant dysfunction. Radiographs show bone on bone arthritis medial and patellofemoral. She presents now for left Total Knee Arthroplasty.    Procedure in detail---   The patient is brought into the operating room and positioned supine on the operating table. After successful administration of  Spinal,   a tourniquet is placed high on the  Left thigh(s) and the lower extremity is prepped and draped in the usual sterile fashion. Time out is performed by the operating team and then the  Left lower extremity is wrapped in Esmarch, knee flexed and the tourniquet inflated to 300 mmHg.       A midline incision is made with a ten blade through the subcutaneous tissue to the level of the extensor mechanism. A fresh blade is used to make a medial parapatellar arthrotomy. Soft tissue over the proximal medial tibia is subperiosteally elevated to the joint line with a knife and into the semimembranosus bursa with a Cobb elevator. Soft tissue over the  proximal lateral tibia is elevated with attention being paid to avoiding the patellar tendon on the tibial tubercle. The patella is everted, knee flexed 90 degrees and the ACL and PCL are removed. Findings are bone on bone medial and patellofemoral with massive global osteophytes.        The drill is used to create a starting hole in the distal femur and the canal is thoroughly irrigated with sterile saline to remove the fatty contents. The 5 degree Left  valgus alignment guide is placed into the femoral canal and the distal femoral cutting block is pinned to remove 9 mm off the distal femur. Resection is made with an oscillating saw.      The tibia is subluxed forward and the menisci are removed. The extramedullary alignment guide is placed referencing proximally at the medial aspect of the tibial tubercle and distally along the second metatarsal axis and tibial crest. The block is pinned to remove 58mm off the more deficient medial  side. Resection is made with an oscillating saw. Size 5is the most appropriate size for the tibia and the proximal tibia is prepared with the modular drill and keel punch for that size.      The femoral sizing guide is placed and size 5 is most appropriate. Rotation is marked off the epicondylar axis and confirmed by creating a rectangular flexion gap at 90 degrees. The size 5 cutting block is pinned in this rotation and the anterior, posterior and chamfer cuts are made with the oscillating saw. The intercondylar block is then placed and that cut is made.  Trial size 5 tibial component, trial size 5 posterior stabilized femur and a 8  mm posterior stabilized rotating platform insert trial is placed. Full extension is achieved with excellent varus/valgus and anterior/posterior balance throughout full range of motion. The patella is everted and thickness measured to be 22  mm. Free hand resection is taken to 12 mm, a 35 template is placed, lug holes are drilled, trial patella is  placed, and it tracks normally. Osteophytes are removed off the posterior femur with the trial in place. All trials are removed and the cut bone surfaces prepared with pulsatile lavage. Cement is mixed and once ready for implantation, the size 5 tibial implant, size  5 posterior stabilized femoral component, and the size 35 patella are cemented in place and the patella is held with the clamp. The trial insert is placed and the knee held in full extension. The Exparel (20 ml mixed with 30 ml saline) and .25% Bupivicaine, are injected into the extensor mechanism, posterior capsule, medial and lateral gutters and subcutaneous tissues.  All extruded cement is removed and once the cement is hard the permanent 8 mm posterior stabilized rotating platform insert is placed into the tibial tray.      The wound is copiously irrigated with saline solution and the extensor mechanism closed over a hemovac drain with #1 V-loc suture. The tourniquet is released for a total tourniquet time of 37  minutes. Flexion against gravity is 140 degrees and the patella tracks normally. Subcutaneous tissue is closed with 2.0 vicryl and subcuticular with running 4.0 Monocryl. The incision is cleaned and dried and steri-strips and a bulky sterile dressing are applied. The limb is placed into a knee immobilizer and the patient is awakened and transported to recovery in stable condition.      Please note that a surgical assistant was a medical necessity for this procedure in order to perform it in a safe and expeditious manner. Surgical assistant was necessary to retract the ligaments and vital neurovascular structures to prevent injury to them and also necessary for proper positioning of the limb to allow for anatomic placement of the prosthesis.   Dione Plover Quade Ramirez, MD    06/29/2016, 1:34 PM

## 2016-06-29 NOTE — Anesthesia Postprocedure Evaluation (Signed)
Anesthesia Post Note  Patient: Diana Scott  Procedure(s) Performed: Procedure(s) (LRB): LEFT TOTAL KNEE ARTHROPLASTY (Left)  Patient location during evaluation: PACU Anesthesia Type: General Level of consciousness: awake and alert Pain management: pain level controlled Vital Signs Assessment: post-procedure vital signs reviewed and stable Respiratory status: spontaneous breathing, nonlabored ventilation, respiratory function stable and patient connected to nasal cannula oxygen Cardiovascular status: blood pressure returned to baseline and stable Postop Assessment: no signs of nausea or vomiting Anesthetic complications: no    Last Vitals:  Vitals:   06/29/16 1550 06/29/16 1659  BP: 137/69 139/77  Pulse: 60 62  Resp: 14 15  Temp: 36.4 C     Last Pain:  Vitals:   06/29/16 1530  TempSrc:   PainSc: 4                  Allyson Tineo DAVID

## 2016-06-29 NOTE — Anesthesia Preprocedure Evaluation (Signed)
Anesthesia Evaluation  Patient identified by MRN, date of birth, ID band Patient awake    Reviewed: Allergy & Precautions, NPO status , Patient's Chart, lab work & pertinent test results  History of Anesthesia Complications (+) PONV  Airway Mallampati: I  TM Distance: >3 FB Neck ROM: Full    Dental   Pulmonary    Pulmonary exam normal        Cardiovascular Normal cardiovascular exam     Neuro/Psych    GI/Hepatic   Endo/Other    Renal/GU      Musculoskeletal   Abdominal   Peds  Hematology   Anesthesia Other Findings   Reproductive/Obstetrics                             Anesthesia Physical Anesthesia Plan  ASA: II  Anesthesia Plan: Spinal   Post-op Pain Management:    Induction: Intravenous  Airway Management Planned: Simple Face Mask  Additional Equipment:   Intra-op Plan:   Post-operative Plan:   Informed Consent: I have reviewed the patients History and Physical, chart, labs and discussed the procedure including the risks, benefits and alternatives for the proposed anesthesia with the patient or authorized representative who has indicated his/her understanding and acceptance.     Plan Discussed with: CRNA and Surgeon  Anesthesia Plan Comments:         Anesthesia Quick Evaluation

## 2016-06-29 NOTE — Transfer of Care (Signed)
Immediate Anesthesia Transfer of Care Note  Patient: Diana Scott  Procedure(s) Performed: Procedure(s) with comments: LEFT TOTAL KNEE ARTHROPLASTY (Left) - Spinal to General (Failed Spinal)  Patient Location: PACU  Anesthesia Type:General and Spinal  Level of Consciousness: sedated  Airway & Oxygen Therapy: Patient Spontanous Breathing and Patient connected to face mask oxygen  Post-op Assessment: Report given to RN and Post -op Vital signs reviewed and stable  Post vital signs: Reviewed and stable  Last Vitals:  Vitals:   06/29/16 1100  BP: (!) 169/92  Pulse: 64  Resp: 16  Temp: 36.6 C    Last Pain:  Vitals:   06/29/16 1100  TempSrc: Oral  PainSc:       Patients Stated Pain Goal: 3 ([id number])  Complications: No apparent anesthesia complications

## 2016-06-30 LAB — CBC
HEMATOCRIT: 37.7 % (ref 36.0–46.0)
Hemoglobin: 12.6 g/dL (ref 12.0–15.0)
MCH: 29.8 pg (ref 26.0–34.0)
MCHC: 33.4 g/dL (ref 30.0–36.0)
MCV: 89.1 fL (ref 78.0–100.0)
Platelets: 230 10*3/uL (ref 150–400)
RBC: 4.23 MIL/uL (ref 3.87–5.11)
RDW: 13.3 % (ref 11.5–15.5)
WBC: 16.2 10*3/uL — AB (ref 4.0–10.5)

## 2016-06-30 LAB — BASIC METABOLIC PANEL
ANION GAP: 7 (ref 5–15)
BUN: 16 mg/dL (ref 6–20)
CALCIUM: 8.2 mg/dL — AB (ref 8.9–10.3)
CO2: 26 mmol/L (ref 22–32)
CREATININE: 0.76 mg/dL (ref 0.44–1.00)
Chloride: 104 mmol/L (ref 101–111)
Glucose, Bld: 121 mg/dL — ABNORMAL HIGH (ref 65–99)
Potassium: 3.9 mmol/L (ref 3.5–5.1)
SODIUM: 137 mmol/L (ref 135–145)

## 2016-06-30 MED ORDER — RIVAROXABAN 10 MG PO TABS: 10.0000 mg | ORAL_TABLET | Freq: Every day | ORAL | 0 refills | Status: DC

## 2016-06-30 MED ORDER — METHOCARBAMOL 500 MG PO TABS: 500.0000 mg | ORAL_TABLET | Freq: Four times a day (QID) | ORAL | 0 refills | Status: DC | PRN

## 2016-06-30 MED ORDER — ONDANSETRON HCL 4 MG PO TABS: 4.0000 mg | ORAL_TABLET | Freq: Four times a day (QID) | ORAL | 0 refills | Status: DC | PRN

## 2016-06-30 MED ORDER — TRAMADOL HCL 50 MG PO TABS: 50.0000 mg | ORAL_TABLET | Freq: Four times a day (QID) | ORAL | 1 refills | Status: DC | PRN

## 2016-06-30 MED ORDER — HYDROMORPHONE HCL 2 MG PO TABS: 2.0000 mg | ORAL_TABLET | ORAL | 0 refills | Status: DC | PRN

## 2016-06-30 NOTE — Evaluation (Signed)
Physical Therapy Evaluation Patient Details Name: Diana Scott MRN: [id number] DOB: 07/23/1956 Today's Date: 06/30/2016   History of Present Illness  Pt s/p L TKR  Clinical Impression  Pt s/p L TKR presents with decreased L LE strength/ROM and post op pain limiting functional mobility.  Pt should progress to dc home with assist of sig other and HHPT follow up.    Follow Up Recommendations Home health PT    Equipment Recommendations  None recommended by PT    Recommendations for Other Services OT consult     Precautions / Restrictions Precautions Precautions: Fall;Knee Required Braces or Orthoses: Knee Immobilizer - Left Knee Immobilizer - Left: Discontinue once straight leg raise with < 10 degree lag Restrictions Weight Bearing Restrictions: No Other Position/Activity Restrictions: WBAT      Mobility  Bed Mobility Overal bed mobility: Needs Assistance Bed Mobility: Sit to Supine       Sit to supine: Min assist   General bed mobility comments: Min cues for sequence and use of R LE to self assist  Transfers Overall transfer level: Needs assistance Equipment used: Rolling walker (2 wheeled) Transfers: Sit to/from Stand Sit to Stand: Min assist         General transfer comment: cues for LE management and use of UEs to self assist  Ambulation/Gait Ambulation/Gait assistance: Min assist;Min guard Ambulation Distance (Feet): 46 Feet Assistive device: Rolling walker (2 wheeled) Gait Pattern/deviations: Step-to pattern;Decreased step length - right;Decreased step length - left;Shuffle;Trunk flexed Gait velocity: decr Gait velocity interpretation: Below normal speed for age/gender General Gait Details: cues for sequence, posture and position from [employer]            Wheelchair Mobility    Modified Rankin (Stroke Patients Only)       Balance                                             Pertinent Vitals/Pain Pain Assessment:  0-10 Pain Score: 7  Pain Location: L knee Pain Descriptors / Indicators: Aching;Sore Pain Intervention(s): Limited activity within patient's tolerance;Monitored during session;Premedicated before session;Ice applied    Home Living Family/patient expects to be discharged to:: Private residence Living Arrangements: Alone Available Help at Discharge: Friend(s) Type of Home: House Home Access: Stairs to enter   [employer] of Steps: 1 Home Layout: One level Home Equipment: Environmental consultant - 2 wheels;Bedside commode      Prior Function Level of Independence: Independent               Hand Dominance   Dominant Hand: Right    Extremity/Trunk Assessment   Upper Extremity Assessment: Overall WFL for tasks assessed           Lower Extremity Assessment: LLE deficits/detail   LLE Deficits / Details: Quad strength 2/5 with AAROM at knee -10-  40  Cervical / Trunk Assessment: Normal  Communication   Communication: No difficulties  Cognition Arousal/Alertness: Awake/alert Behavior During Therapy: WFL for tasks assessed/performed Overall Cognitive Status: Within Functional Limits for tasks assessed                      General Comments      Exercises Total Joint Exercises Ankle Circles/Pumps: AROM;Both;15 reps;Supine Quad Sets: AROM;Both;10 reps;Supine Heel Slides: AAROM;Left;Supine;10 reps Straight Leg Raises: AAROM;Left;10 reps;Supine      Assessment/Plan  PT Assessment Patient needs continued PT services  PT Diagnosis Difficulty walking   PT Problem List Decreased strength;Decreased range of motion;Decreased activity tolerance;Decreased mobility;Pain;Decreased knowledge of use of DME  PT Treatment Interventions DME instruction;Gait training;Stair training;Functional mobility training;Therapeutic activities;Therapeutic exercise;Patient/family education   PT Goals (Current goals can be found in the Care Plan section) Acute Rehab PT Goals Patient  Stated Goal: Regain IND PT Goal Formulation: With patient Time For Goal Achievement: 07/02/16 Potential to Achieve Goals: Good    Frequency 7X/week   Barriers to discharge        Co-evaluation               End of Session Equipment Utilized During Treatment: Gait belt;Left knee immobilizer Activity Tolerance: Patient tolerated treatment well Patient left: in chair;with call bell/phone within reach;with family/visitor present Nurse Communication: Mobility status         Time: [id number] PT Time Calculation (min) (ACUTE ONLY): 38 min   Charges:   PT Evaluation $PT Eval Low Complexity: 1 Procedure PT Treatments $Gait Training: 8-22 mins $Therapeutic Exercise: 8-22 mins   PT G Codes:        Diana Scott 26-Jul-2016, 1:18 PM

## 2016-06-30 NOTE — Progress Notes (Signed)
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  Checked in for needs and she reports none.  [name] 06/30/2016, 12:03 PM  Lesle Chris, OTR/L [phone] 06/30/2016

## 2016-06-30 NOTE — Progress Notes (Signed)
   Subjective: 1 Day Post-Op Procedure(s) (LRB): LEFT TOTAL KNEE ARTHROPLASTY (Left) Patient reports pain as mild.   Patient seen in rounds by Dr. Wynelle Link. Patient is well, but has had some minor complaints of pain in the knee, requiring pain medications We will start therapy today.  If they do well with therapy and meets all goals, then will allow home later this afternoon following therapy. Plan is to go Home after hospital stay.  Objective: Vital signs in last 24 hours: Temp:  [97.4 F (36.3 C)-98.6 F (37 C)] 98.6 F (37 C) (09/14 0504) Pulse Rate:  [59-92] 59 (09/14 0504) Resp:  [12-17] 16 (09/14 0504) BP: (127-169)/(69-96) 155/69 (09/14 0504) SpO2:  [92 %-100 %] 98 % (09/14 0504) Weight:  [75.3 kg (166 lb)] 75.3 kg (166 lb) (09/13 1022)  Intake/Output from previous day:  Intake/Output Summary (Last 24 hours) at 06/30/16 0718 Last data filed at 06/30/16 0527  Gross per 24 hour  Intake          2451.25 ml  Output             1072 ml  Net          1379.25 ml    Intake/Output this shift: No intake/output data recorded.  Labs:  Recent Labs  06/30/16 0427  HGB 12.6    Recent Labs  06/30/16 0427  WBC 16.2*  RBC 4.23  HCT 37.7  PLT 230    Recent Labs  06/30/16 0427  NA 137  K 3.9  CL 104  CO2 26  BUN 16  CREATININE 0.76  GLUCOSE 121*  CALCIUM 8.2*   No results for input(s): LABPT, INR in the last 72 hours.  EXAM General - Patient is Alert, Appropriate and Oriented Extremity - Neurovascular intact Sensation intact distally Dorsiflexion/Plantar flexion intact Dressing - dressing C/D/I Motor Function - intact, moving foot and toes well on exam.  Hemovac pulled without difficulty.  Past Medical History:  Diagnosis Date  . Anemia   . Asthma    childhood   . Complication of anesthesia    sensitive to anesthesia; vertigo episode after anesthesia; nausea and vomiting   . History of blood transfusion   . History of bronchitis   . PONV  (postoperative nausea and vomiting)   . Rectal bleeding   . Thyroid disease   . Vertigo     Assessment/Plan: 1 Day Post-Op Procedure(s) (LRB): LEFT TOTAL KNEE ARTHROPLASTY (Left) Principal Problem:   OA (osteoarthritis) of knee  Estimated body mass index is 31.37 kg/m as calculated from the following:   Height as of this encounter: 5\' 1"  (1.549 m).   Weight as of this encounter: 75.3 kg (166 lb). Advance diet Up with therapy Discharge home with home health  DVT Prophylaxis - Xarelto Weight-Bearing as tolerated to left leg D/C O2 and Pulse OX and try on Room Air  If meets goals and able to go home: Discharge home with home health Diet - Regular diet Follow up - in 2 weeks Activity - WBAT Disposition - Home Condition Upon Discharge - improving D/C Meds - See DC Summary DVT Prophylaxis - [place], PA-C Orthopaedic Surgery

## 2016-06-30 NOTE — Progress Notes (Signed)
RN reviewed discharge paperwork with patient and family. All questions answered. Gave PRN Zofran before discharge.  Paperwork and prescriptions given.  NT rolled patient down to family car.

## 2016-06-30 NOTE — Care Management Note (Signed)
Case Management Note  Patient Details  Name: Diana Scott MRN: [id number] Date of Birth: 09/21/1956  Subjective/Objective:                  LEFT TOTAL KNEE ARTHROPLASTY (Left) Action/Plan: Discharge planning Expected Discharge Date:  06/30/16              Expected Discharge Plan:  Home w Home Health Services  In-House Referral:     Discharge planning Services  CM Consult  Post Acute Care Choice:  Home Health Choice offered to:  Patient  DME Arranged:  N/A DME Agency:  NA  HH Arranged:  PT HH Agency:  [facility] (formerly [facility])  Status of Service:  Completed, signed off  If discussed at Long Length of Stay Meetings, dates discussed:    Additional Comments: CM met with pt in room to offer choice of home health agency.  Pt chooses [facility] to render HHPT.  Referral given to [facility] rep, Tim.  Pt states she has both a bedside commode and rolling walker at home. Jeffries, Sarah Christine, RN 06/30/2016, 1:59 PM  

## 2016-06-30 NOTE — Progress Notes (Signed)
Physical Therapy Treatment Patient Details Name: Diana Scott MRN: [id number] DOB: 1955/10/19 Today's Date: 06/30/2016    History of Present Illness Pt s/p L TKR    PT Comments    Pt progressing with mobility and eager for return home.  Reviewed home therex, stairs and don/doff KI with pt and significant other.  Follow Up Recommendations  Home health PT     Equipment Recommendations  None recommended by PT    Recommendations for Other Services OT consult     Precautions / Restrictions Precautions Precautions: Fall;Knee Required Braces or Orthoses: Knee Immobilizer - Left Knee Immobilizer - Left: Discontinue once straight leg raise with < 10 degree lag Restrictions Weight Bearing Restrictions: No Other Position/Activity Restrictions: WBAT    Mobility  Bed Mobility Overal bed mobility: Needs Assistance Bed Mobility: Sit to Supine       Sit to supine: Min assist   General bed mobility comments: Min cues for sequence and use of R LE to self assist  Transfers Overall transfer level: Needs assistance Equipment used: Rolling walker (2 wheeled) Transfers: Sit to/from Stand Sit to Stand: Min guard         General transfer comment: cues for LE management and use of UEs to self assist  Ambulation/Gait Ambulation/Gait assistance: Min guard;Supervision Ambulation Distance (Feet): 75 Feet Assistive device: Rolling walker (2 wheeled) Gait Pattern/deviations: Step-to pattern;Decreased step length - right;Decreased step length - left;Shuffle;Trunk flexed Gait velocity: decr Gait velocity interpretation: Below normal speed for age/gender General Gait Details: cues for sequence, posture and position from RW   Stairs Stairs: Yes Stairs assistance: Min assist Stair Management: No rails;Step to pattern;Backwards;Forwards;With walker Number of Stairs: 4 General stair comments: single step twice fwd and twice bkwd with RW and min assist.  Cues for sequence and foot/RW  placement.  SIg other assisting  Wheelchair Mobility    Modified Rankin (Stroke Patients Only)       Balance                                    Cognition Arousal/Alertness: Awake/alert Behavior During Therapy: WFL for tasks assessed/performed Overall Cognitive Status: Within Functional Limits for tasks assessed                      Exercises      General Comments        Pertinent Vitals/Pain Pain Assessment: 0-10 Pain Score: 5  Pain Location: L knee Pain Descriptors / Indicators: Aching;Sore Pain Intervention(s): Limited activity within patient's tolerance;Monitored during session;Premedicated before session;Ice applied    Home Living                      Prior Function            PT Goals (current goals can now be found in the care plan section) Acute Rehab PT Goals Patient Stated Goal: Regain IND PT Goal Formulation: With patient Time For Goal Achievement: 07/02/16 Potential to Achieve Goals: Good Progress towards PT goals: Progressing toward goals    Frequency  7X/week    PT Plan Current plan remains appropriate    Co-evaluation             End of Session Equipment Utilized During Treatment: Gait belt;Left knee immobilizer Activity Tolerance: Patient tolerated treatment well Patient left: in bed;with call bell/phone within reach;with family/visitor present     Time: [id number]  PT Time Calculation (min) (ACUTE ONLY): 28 min  Charges:  $Gait Training: 8-22 mins $Therapeutic Activity: 8-22 mins                    G Codes:      Jalayla Chrismer 28-Jul-2016, 5:09 PM

## 2016-06-30 NOTE — Discharge Summary (Signed)
Physician Discharge Summary   Patient ID: Diana Scott MRN: [id number] DOB/AGE: 17-Jan-1956 60 y.o.  Admit date: 06/29/2016 Discharge date: 06/30/2016  Primary Diagnosis:  Osteoarthritis  Left knee(s)  Admission Diagnoses:  Past Medical History:  Diagnosis Date  . Anemia   . Asthma    childhood   . Complication of anesthesia    sensitive to anesthesia; vertigo episode after anesthesia; nausea and vomiting   . History of blood transfusion   . History of bronchitis   . PONV (postoperative nausea and vomiting)   . Rectal bleeding   . Thyroid disease   . Vertigo    Discharge Diagnoses:   Principal Problem:   OA (osteoarthritis) of knee  Estimated body mass index is 31.37 kg/m as calculated from the following:   Height as of this encounter: 5' 1"  (1.549 m).   Weight as of this encounter: 75.3 kg (166 lb).  Procedure:  Procedure(s) (LRB): LEFT TOTAL KNEE ARTHROPLASTY (Left)   Consults: None  HPI: Diana Scott is a 60 y.o. year old female with end stage OA of her left knee with progressively worsening pain and dysfunction. She has constant pain, with activity and at rest and significant functional deficits with difficulties even with ADLs. She has had extensive non-op management including analgesics, injections of cortisone and viscosupplements, and home exercise program, but remains in significant pain with significant dysfunction. Radiographs show bone on bone arthritis medial and patellofemoral. She presents now for left Total Knee Arthroplasty.    Laboratory Data: Admission on 06/29/2016  Component Date Value Ref Range Status  . WBC 06/30/2016 16.2* 4.0 - 10.5 K/uL Final  . RBC 06/30/2016 4.23  3.87 - 5.11 MIL/uL Final  . Hemoglobin 06/30/2016 12.6  12.0 - 15.0 g/dL Final  . HCT 06/30/2016 37.7  36.0 - 46.0 % Final  . MCV 06/30/2016 89.1  78.0 - 100.0 fL Final  . MCH 06/30/2016 29.8  26.0 - 34.0 pg Final  . MCHC 06/30/2016 33.4  30.0 - 36.0 g/dL Final  . RDW 06/30/2016  13.3  11.5 - 15.5 % Final  . Platelets 06/30/2016 230  150 - 400 K/uL Final  . Sodium 06/30/2016 137  135 - 145 mmol/L Final  . Potassium 06/30/2016 3.9  3.5 - 5.1 mmol/L Final  . Chloride 06/30/2016 104  101 - 111 mmol/L Final  . CO2 06/30/2016 26  22 - 32 mmol/L Final  . Glucose, Bld 06/30/2016 121* 65 - 99 mg/dL Final  . BUN 06/30/2016 16  6 - 20 mg/dL Final  . Creatinine, Ser 06/30/2016 0.76  0.44 - 1.00 mg/dL Final  . Calcium 06/30/2016 8.2* 8.9 - 10.3 mg/dL Final  . GFR calc non Af Amer 06/30/2016 >60  >60 mL/min Final  . GFR calc Af Amer 06/30/2016 >60  >60 mL/min Final   Comment: (NOTE) The eGFR has been calculated using the CKD EPI equation. This calculation has not been validated in all clinical situations. eGFR's persistently <60 mL/min signify possible Chronic Kidney Disease.   Georgiann Hahn gap 06/30/2016 7  5 - 15 Final  Hospital Outpatient Visit on 06/22/2016  Component Date Value Ref Range Status  . aPTT 06/22/2016 27  24 - 36 seconds Final  . Prothrombin Time 06/22/2016 12.9  11.4 - 15.2 seconds Final  . INR 06/22/2016 0.97   Final  . ABO/RH(D) 06/29/2016 A POS   Final  . Antibody Screen 06/29/2016 NEG   Final  . Sample Expiration 06/29/2016 07/02/2016   Final  .  Extend sample reason 06/29/2016 NO TRANSFUSIONS OR PREGNANCY IN THE PAST 3 MONTHS   Final  . Color, Urine 06/22/2016 YELLOW  YELLOW Final  . APPearance 06/22/2016 CLOUDY* CLEAR Final  . Specific Gravity, Urine 06/22/2016 1.029  1.005 - 1.030 Final  . pH 06/22/2016 5.0  5.0 - 8.0 Final  . Glucose, UA 06/22/2016 NEGATIVE  NEGATIVE mg/dL Final  . Hgb urine dipstick 06/22/2016 TRACE* NEGATIVE Final  . Bilirubin Urine 06/22/2016 NEGATIVE  NEGATIVE Final  . Ketones, ur 06/22/2016 NEGATIVE  NEGATIVE mg/dL Final  . Protein, ur 06/22/2016 NEGATIVE  NEGATIVE mg/dL Final  . Nitrite 06/22/2016 NEGATIVE  NEGATIVE Final  . Leukocytes, UA 06/22/2016 NEGATIVE  NEGATIVE Final  . MRSA, PCR 06/22/2016 NEGATIVE  NEGATIVE Final    . Staphylococcus aureus 06/22/2016 NEGATIVE  NEGATIVE Final   Comment:        The Xpert SA Assay (FDA approved for NASAL specimens in patients over 57 years of age), is one component of a comprehensive surveillance program.  Test performance has been validated by Diana Scott for patients greater than or equal to 63 year old. It is not intended to diagnose infection nor to guide or monitor treatment.   . ABO/RH(D) 06/22/2016 A POS   Final  . Squamous Epithelial / LPF 06/22/2016 6-30* NONE SEEN Final  . WBC, UA 06/22/2016 0-5  0 - 5 WBC/hpf Final  . RBC / HPF 06/22/2016 0-5  0 - 5 RBC/hpf Final  . Bacteria, UA 06/22/2016 MANY* NONE SEEN Final     X-Rays:No results found.  EKG:No orders found for this or any previous visit.   Hospital Course: Diana Scott is a 60 y.o. who was admitted to [facility]. They were brought to the operating room on 06/29/2016 and underwent Procedure(s): LEFT TOTAL KNEE ARTHROPLASTY.  Patient tolerated the procedure well and was later transferred to the recovery room and then to the orthopaedic floor for postoperative care.  They were given PO and IV analgesics for pain control following their surgery.  They were given 24 hours of postoperative antibiotics of  Anti-infectives    Start     Dose/Rate Route Frequency Ordered Stop   06/29/16 1830  ceFAZolin (ANCEF) IVPB 2g/100 mL premix     2 g 200 mL/hr over 30 Minutes Intravenous Every 6 hours 06/29/16 1602 06/30/16 0200   06/29/16 1008  ceFAZolin (ANCEF) IVPB 2g/100 mL premix     2 g 200 mL/hr over 30 Minutes Intravenous On call to O.R. 06/29/16 1008 06/29/16 1230     and started on DVT prophylaxis in the form of Xarelto.   PT and OT were ordered for total joint protocol.  Discharge planning consulted to help with postop disposition and equipment needs.  Patient had a good night on the evening of surgery.  They started to get up OOB with therapy on day one. Hemovac drain was pulled without  difficulty. Dressing was checked and was clean and dry.  Patient was seen in rounds and as long as they do well with therapy and meets all goals, then allowed home later that afternoon following therapy.  Discharge home with home health Diet - Regular diet Follow up - in 2 weeks Activity - WBAT Disposition - Home Condition Upon Discharge - improving D/C Meds - See DC Summary DVT Prophylaxis - Xarelto  Discharge Instructions    Call MD / Call 911    Complete by:  As directed    If you experience chest pain  or shortness of breath, CALL 911 and be transported to the hospital emergency room.  If you develope a fever above 101 F, pus (white drainage) or increased drainage or redness at the wound, or calf pain, call your surgeon's office.   Change dressing    Complete by:  As directed    Change dressing daily with sterile 4 x 4 inch gauze dressing and apply TED hose. Do not submerge the incision under water.   Constipation Prevention    Complete by:  As directed    Drink plenty of fluids.  Prune juice may be helpful.  You may use a stool softener, such as Colace (over the counter) 100 mg twice a day.  Use MiraLax (over the counter) for constipation as needed.   Diet general    Complete by:  As directed    Discharge instructions    Complete by:  As directed    Pick up stool softner and laxative for home use following surgery while on pain medications. Do not submerge incision under water. Please use good hand washing techniques while changing dressing each day. May shower starting three days after surgery. Please use a clean towel to pat the incision dry following showers. Continue to use ice for pain and swelling after surgery. Do not use any lotions or creams on the incision until instructed by your surgeon.   Postoperative Constipation Protocol  Constipation - defined medically as fewer than three stools per week and severe constipation as less than one stool per week.  One of the  most common issues patients have following surgery is constipation.  Even if you have a regular bowel pattern at home, your normal regimen is likely to be disrupted due to multiple reasons following surgery.  Combination of anesthesia, postoperative narcotics, change in appetite and fluid intake all can affect your bowels.  In order to avoid complications following surgery, here are some recommendations in order to help you during your recovery period.  Colace (docusate) - Pick up an over-the-counter form of Colace or another stool softener and take twice a day as long as you are requiring postoperative pain medications.  Take with a full glass of water daily.  If you experience loose stools or diarrhea, hold the colace until you stool forms back up.  If your symptoms do not get better within 1 week or if they get worse, check with your doctor.  Dulcolax (bisacodyl) - Pick up over-the-counter and take as directed by the product packaging as needed to assist with the movement of your bowels.  Take with a full glass of water.  Use this product as needed if not relieved by Colace only.   MiraLax (polyethylene glycol) - Pick up over-the-counter to have on hand.  MiraLax is a solution that will increase the amount of water in your bowels to assist with bowel movements.  Take as directed and can mix with a glass of water, juice, soda, coffee, or tea.  Take if you go more than two days without a movement. Do not use MiraLax more than once per day. Call your doctor if you are still constipated or irregular after using this medication for 7 days in a row.  If you continue to have problems with postoperative constipation, please contact the office for further assistance and recommendations.  If you experience "the worst abdominal pain ever" or develop nausea or vomiting, please contact the office immediatly for further recommendations for treatment.   Take Xarelto for two and  a half more weeks, then discontinue  Xarelto. Once the patient has completed the blood thinner regimen, then take a Baby 81 mg Aspirin daily for three more weeks.   Do not put a pillow under the knee. Place it under the heel.    Complete by:  As directed    Do not sit on low chairs, stoools or toilet seats, as it may be difficult to get up from low surfaces    Complete by:  As directed    Driving restrictions    Complete by:  As directed    No driving until released by the physician.   Increase activity slowly as tolerated    Complete by:  As directed    Lifting restrictions    Complete by:  As directed    No lifting until released by the physician.   Patient may shower    Complete by:  As directed    You may shower without a dressing once there is no drainage.  Do not wash over the wound.  If drainage remains, do not shower until drainage stops.   TED hose    Complete by:  As directed    Use stockings (TED hose) for 3 weeks on both leg(s).  You may remove them at night for sleeping.   Weight bearing as tolerated    Complete by:  As directed        Medication List    STOP taking these medications   Fish Oil 1000 MG Caps   ibandronate 150 MG tablet Commonly known as:  BONIVA   meloxicam 15 MG tablet Commonly known as:  MOBIC   pyridoxine 100 MG tablet Commonly known as:  B-6   vitamin B-12 500 MCG tablet Commonly known as:  CYANOCOBALAMIN   vitamin C 500 MG tablet Commonly known as:  ASCORBIC ACID     TAKE these medications   CRESTOR 10 MG tablet Generic drug:  rosuvastatin Take 5 mg by mouth See admin instructions. Pt takes for 3 days and 3 days off. This is to help tolerate med.   HYDROmorphone 2 MG tablet Commonly known as:  DILAUDID Take 1-2 tablets (2-4 mg total) by mouth every 4 (four) hours as needed for severe pain.   levothyroxine 88 MCG tablet Commonly known as:  SYNTHROID, LEVOTHROID Take 88 mcg by mouth daily.   methocarbamol 500 MG tablet Commonly known as:  ROBAXIN Take 1 tablet  (500 mg total) by mouth every 6 (six) hours as needed for muscle spasms.   ondansetron 4 MG tablet Commonly known as:  ZOFRAN Take 1 tablet (4 mg total) by mouth every 6 (six) hours as needed for nausea.   rivaroxaban 10 MG Tabs tablet Commonly known as:  XARELTO Take 1 tablet (10 mg total) by mouth daily with breakfast. Take Xarelto for two and a half more weeks, then discontinue Xarelto. Once the patient has completed the blood thinner regimen, then take a Baby 81 mg Aspirin daily for three more weeks.   traMADol 50 MG tablet Commonly known as:  ULTRAM Take 1-2 tablets (50-100 mg total) by mouth every 6 (six) hours as needed (mild pain).      Follow-up Information    Gearlean Alf, MD. Schedule an appointment as soon as possible for a visit on 07/12/2016.   Specialty:  Orthopedic Surgery Why:  Call office to setup appointment for Tuesday 07/12/16 with Dr. Wynelle Link. Contact information: [address] [place] [address] [phone]  Signed: Arlee Muslim, PA-C Orthopaedic Surgery 06/30/2016, 7:25 AM

## 2016-06-30 NOTE — Discharge Instructions (Addendum)
-  Dr. Gaynelle Arabian Total Joint Specialist [facility] [address] [place], [place] [address] [phone]  TOTAL KNEE REPLACEMENT POSTOPERATIVE DIRECTIONS  Knee Rehabilitation, Guidelines Following Surgery  Results after knee surgery are often greatly improved when you follow the exercise, range of motion and muscle strengthening exercises prescribed by your doctor. Safety measures are also important to protect the knee from further injury. Any time any of these exercises cause you to have increased pain or swelling in your knee joint, decrease the amount until you are comfortable again and slowly increase them. If you have problems or questions, call your caregiver or physical therapist for advice.   HOME CARE INSTRUCTIONS  Remove items at home which could result in a fall. This includes throw rugs or furniture in walking pathways.   ICE to the affected knee every three hours for 30 minutes at a time and then as needed for pain and swelling.  Continue to use ice on the knee for pain and swelling from surgery. You may notice swelling that will progress down to the foot and ankle.  This is normal after surgery.  Elevate the leg when you are not up walking on it.    Continue to use the breathing machine which will help keep your temperature down.  It is common for your temperature to cycle up and down following surgery, especially at night when you are not up moving around and exerting yourself.  The breathing machine keeps your lungs expanded and your temperature down.  Do not place pillow under knee, focus on keeping the knee straight while resting  DIET You may resume your previous home diet once your are discharged from the hospital.  DRESSING / WOUND CARE / SHOWERING You may shower 3 days after surgery, but keep the wounds dry during showering.  You may use an occlusive plastic wrap (Press'n Seal for example), NO SOAKING/SUBMERGING IN THE BATHTUB.  If the  bandage gets wet, change with a clean dry gauze.  If the incision gets wet, pat the wound dry with a clean towel. You may start showering once you are discharged home but do not submerge the incision under water. Just pat the incision dry and apply a dry gauze dressing on daily. Change the surgical dressing daily and reapply a dry dressing each time.  ACTIVITY Walk with your walker as instructed. Use walker as long as suggested by your caregivers. Avoid periods of inactivity such as sitting longer than an hour when not asleep. This helps prevent blood clots.  You may resume a sexual relationship in one month or when given the OK by your doctor.  You may return to work once you are cleared by your doctor.  Do not drive a car for 6 weeks or until released by you surgeon.  Do not drive while taking narcotics.  WEIGHT BEARING Weight bearing as tolerated with assist device (walker, cane, etc) as directed, use it as long as suggested by your surgeon or therapist, typically at least 4-6 weeks.  POSTOPERATIVE CONSTIPATION PROTOCOL Constipation - defined medically as fewer than three stools per week and severe constipation as less than one stool per week.  One of the most common issues patients have following surgery is constipation.  Even if you have a regular bowel pattern at home, your normal regimen is likely to be disrupted due to multiple reasons following surgery.  Combination of anesthesia, postoperative narcotics, change in appetite and fluid intake all can affect your bowels.  In order to avoid complications following surgery, here are some recommendations in order to help you during your recovery period. ° °Colace (docusate) - Pick up an over-the-counter form of Colace or another stool softener and take twice a day as long as you are requiring postoperative pain medications.  Take with a full glass of water daily.  If you experience loose stools or diarrhea, hold the colace until you stool forms  back up.  If your symptoms do not get better within 1 week or if they get worse, check with your doctor. ° °Dulcolax (bisacodyl) - Pick up over-the-counter and take as directed by the product packaging as needed to assist with the movement of your bowels.  Take with a full glass of water.  Use this product as needed if not relieved by Colace only.  ° °MiraLax (polyethylene glycol) - Pick up over-the-counter to have on hand.  MiraLax is a solution that will increase the amount of water in your bowels to assist with bowel movements.  Take as directed and can mix with a glass of water, juice, soda, coffee, or tea.  Take if you go more than two days without a movement. °Do not use MiraLax more than once per day. Call your doctor if you are still constipated or irregular after using this medication for 7 days in a row. ° °If you continue to have problems with postoperative constipation, please contact the office for further assistance and recommendations.  If you experience "the worst abdominal pain ever" or develop nausea or vomiting, please contact the office immediatly for further recommendations for treatment. ° °ITCHING ° If you experience itching with your medications, try taking only a single pain pill, or even half a pain pill at a time.  You can also use Benadryl over the counter for itching or also to help with sleep.  ° °TED HOSE STOCKINGS °Wear the elastic stockings on both legs for three weeks following surgery during the day but you may remove then at night for sleeping. ° °MEDICATIONS °See your medication summary on the “After Visit Summary” that the nursing staff will review with you prior to discharge.  You may have some home medications which will be placed on hold until you complete the course of blood thinner medication.  It is important for you to complete the blood thinner medication as prescribed by your surgeon.  Continue your approved medications as instructed at time of  discharge. ° °PRECAUTIONS °If you experience chest pain or shortness of breath - call 911 immediately for transfer to the hospital emergency department.  °If you develop a fever greater that 101 F, purulent drainage from wound, increased redness or drainage from wound, foul odor from the wound/dressing, or calf pain - CONTACT YOUR SURGEON.   °                                                °FOLLOW-UP APPOINTMENTS °Make sure you keep all of your appointments after your operation with your surgeon and caregivers. You should call the office at the above phone number and make an appointment for approximately two weeks after the date of your surgery or on the date instructed by your surgeon outlined in the "After Visit Summary". ° ° °RANGE OF MOTION AND STRENGTHENING EXERCISES  °Rehabilitation of the knee is important following a knee injury or   an operation. After just a few days of immobilization, the muscles of the thigh which control the knee become weakened and shrink (atrophy). Knee exercises are designed to build up the tone and strength of the thigh muscles and to improve knee motion. Often times heat used for twenty to thirty minutes before working out will loosen up your tissues and help with improving the range of motion but do not use heat for the first two weeks following surgery. These exercises can be done on a training (exercise) mat, on the floor, on a table or on a bed. Use what ever works the best and is most comfortable for you Knee exercises include:  Leg Lifts - While your knee is still immobilized in a splint or cast, you can do straight leg raises. Lift the leg to 60 degrees, hold for 3 sec, and slowly lower the leg. Repeat 10-20 times 2-3 times daily. Perform this exercise against resistance later as your knee gets better.  Quad and Hamstring Sets - Tighten up the muscle on the front of the thigh (Quad) and hold for 5-10 sec. Repeat this 10-20 times hourly. Hamstring sets are done by pushing the  foot backward against an object and holding for 5-10 sec. Repeat as with quad sets.   Leg Slides: Lying on your back, slowly slide your foot toward your buttocks, bending your knee up off the floor (only go as far as is comfortable). Then slowly slide your foot back down until your leg is flat on the floor again.  Angel Wings: Lying on your back spread your legs to the side as far apart as you can without causing discomfort.  A rehabilitation program following serious knee injuries can speed recovery and prevent re-injury in the future due to weakened muscles. Contact your doctor or a physical therapist for more information on knee rehabilitation.   IF YOU ARE TRANSFERRED TO A SKILLED REHAB FACILITY If the patient is transferred to a skilled rehab facility following release from the hospital, a list of the current medications will be sent to the facility for the patient to continue.  When discharged from the skilled rehab facility, please have the facility set up the patient's [place] prior to being released. Also, the skilled facility will be responsible for providing the patient with their medications at time of release from the facility to include their pain medication, the muscle relaxants, and their blood thinner medication. If the patient is still at the rehab facility at time of the two week follow up appointment, the skilled rehab facility will also need to assist the patient in arranging follow up appointment in our office and any transportation needs.  MAKE SURE YOU:  Understand these instructions.  Get help right away if you are not doing well or get worse.    Pick up stool softner and laxative for home use following surgery while on pain medications. Do not submerge incision under water. Please use good hand washing techniques while changing dressing each day. May shower starting three days after surgery. Please use a clean towel to pat the incision dry following  showers. Continue to use ice for pain and swelling after surgery. Do not use any lotions or creams on the incision until instructed by your surgeon.  Take Xarelto for two and a half more weeks, then discontinue Xarelto. Once the patient has completed the blood thinner regimen, then take a Baby 81 mg Aspirin daily for three more weeks.  Information on  my medicine - XARELTO (Rivaroxaban)  This medication education was reviewed with me or my healthcare representative as part of my discharge preparation.  The pharmacist that spoke with me during my hospital stay was:  Altha Harm  Why was Xarelto prescribed for you? Xarelto was prescribed for you to reduce the risk of blood clots forming after orthopedic surgery. The medical term for these abnormal blood clots is venous thromboembolism (VTE).  What do you need to know about xarelto ? Take your Xarelto ONCE DAILY at the same time every day. You may take it either with or without food.  If you have difficulty swallowing the tablet whole, you may crush it and mix in applesauce just prior to taking your dose.  Take Xarelto exactly as prescribed by your doctor and DO NOT stop taking Xarelto without talking to the doctor who prescribed the medication.  Stopping without other VTE prevention medication to take the place of Xarelto may increase your risk of developing a clot.  After discharge, you should have regular check-up appointments with your healthcare provider that is prescribing your Xarelto.    What do you do if you miss a dose? If you miss a dose, take it as soon as you remember on the same day then continue your regularly scheduled once daily regimen the next day. Do not take two doses of Xarelto on the same day.   Important Safety Information A possible side effect of Xarelto is bleeding. You should call your healthcare provider right away if you experience any of the following: ? Bleeding from an injury or your nose that does  not stop. ? Unusual colored urine (red or dark brown) or unusual colored stools (red or black). ? Unusual bruising for unknown reasons. ? A serious fall or if you hit your head (even if there is no bleeding).  Some medicines may interact with Xarelto and might increase your risk of bleeding while on Xarelto. To help avoid this, consult your healthcare provider or pharmacist prior to using any new prescription or non-prescription medications, including herbals, vitamins, non-steroidal anti-inflammatory drugs (NSAIDs) and supplements.  This website has more information on Xarelto: [email or web address]

## 2019-10-07 ENCOUNTER — Encounter: Payer: Self-pay | Admitting: Gastroenterology

## 2020-03-19 ENCOUNTER — Encounter: Payer: Self-pay | Admitting: Gastroenterology

## 2020-05-06 ENCOUNTER — Ambulatory Visit (AMBULATORY_SURGERY_CENTER): Payer: Self-pay

## 2020-05-06 ENCOUNTER — Other Ambulatory Visit: Payer: Self-pay

## 2020-05-06 VITALS — Ht 61.0 in | Wt 195.4 lb

## 2020-05-06 DIAGNOSIS — Z01818 Encounter for other preprocedural examination: Secondary | ICD-10-CM

## 2020-05-06 DIAGNOSIS — Z86010 Personal history of colonic polyps: Secondary | ICD-10-CM

## 2020-05-06 MED ORDER — CLENPIQ 10-3.5-12 MG-GM -GM/160ML PO SOLN: 1.0000 | ORAL | 0 refills | Status: AC

## 2020-05-06 NOTE — Progress Notes (Signed)
No egg or soy allergy known to patient  Issues with past sedation with any surgeries or procedures-PONV per pt No intubation problems in the past  No diet pills per patient No home 02 use per patient  No blood thinners per patient  Pt denies issues with constipation  No A fib or A flutter   COVID 19 guidelines implemented in [facility] today  Coupon given to pt in [facility] today  COVID screening scheduled on 05/14/2020 at 1:00 pm;   Due to the COVID-19 pandemic we are asking patients to follow these guidelines. Please only bring one care partner. Please be aware that your care partner may wait in the car in the parking lot or if they feel like they will be too hot to wait in the car, they may wait in the lobby on the 4th floor. All care partners are required to wear a mask the entire time (we do not have any that we can provide them), they need to practice social distancing, and we will do a Covid check for all patient's and care partners when you arrive. Also we will check their temperature and your temperature. If the care partner waits in their car they need to stay in the parking lot the entire time and we will call them on their cell phone when the patient is ready for discharge so they can bring the car to the front of the building. Also all patient's will need to wear a mask into building.

## 2020-05-07 ENCOUNTER — Telehealth: Payer: Self-pay | Admitting: Gastroenterology

## 2020-05-07 NOTE — Telephone Encounter (Signed)
Pt is requesting a call back regarding her prep solution CLENPIQ. Pt states this is costing her $200 which she does not have.

## 2020-05-07 NOTE — Telephone Encounter (Signed)
Pt states copay is $200 for clenpiq.  Let pt know we will give her the miralax prep with OTC items, informed pt to buy miralax 238 gm bottle, dulcolax laxative 4 tablets needed, and 64 oz of gatorade, no red or purple, pt verb understanding, pt coming in early next week to p/u instructions, let pt know to review instructions at home and call us back if any questions.

## 2020-05-14 ENCOUNTER — Ambulatory Visit (INDEPENDENT_AMBULATORY_CARE_PROVIDER_SITE_OTHER): Payer: Self-pay

## 2020-05-14 ENCOUNTER — Other Ambulatory Visit: Payer: Self-pay | Admitting: Gastroenterology

## 2020-05-14 DIAGNOSIS — Z1159 Encounter for screening for other viral diseases: Secondary | ICD-10-CM

## 2020-05-14 LAB — SARS CORONAVIRUS 2 (TAT 6-24 HRS): SARS Coronavirus 2: NEGATIVE

## 2020-05-18 ENCOUNTER — Other Ambulatory Visit: Payer: Self-pay

## 2020-05-18 ENCOUNTER — Ambulatory Visit (AMBULATORY_SURGERY_CENTER): Payer: BC Managed Care – PPO | Admitting: Gastroenterology

## 2020-05-18 ENCOUNTER — Encounter: Payer: Self-pay | Admitting: Gastroenterology

## 2020-05-18 VITALS — BP 158/87 | HR 63 | Temp 97.5°F | Resp 21 | Ht 61.0 in | Wt 195.0 lb

## 2020-05-18 DIAGNOSIS — D124 Benign neoplasm of descending colon: Secondary | ICD-10-CM | POA: Diagnosis not present

## 2020-05-18 DIAGNOSIS — Z86010 Personal history of colonic polyps: Secondary | ICD-10-CM | POA: Diagnosis not present

## 2020-05-18 NOTE — Progress Notes (Signed)
Called to room to assist during endoscopic procedure.  Patient ID and intended procedure confirmed with present staff. Received instructions for my participation in the procedure from the performing physician.  

## 2020-05-18 NOTE — Progress Notes (Signed)
To PACU, VSS. Report to [email or web address] 

## 2020-05-18 NOTE — Progress Notes (Signed)
Vs SP 

## 2020-05-18 NOTE — Patient Instructions (Signed)
YOU HAD AN ENDOSCOPIC PROCEDURE TODAY AT [facility]:   Refer to the procedure report that was given to you for any specific questions about what was found during the examination.  If the procedure report does not answer your questions, please call your gastroenterologist to clarify.  If you requested that your care partner not be given the details of your procedure findings, then the procedure report has been included in a sealed envelope for you to review at your convenience later.  YOU SHOULD EXPECT: Some feelings of bloating in the abdomen. Passage of more gas than usual.  Walking can help get rid of the air that was put into your GI tract during the procedure and reduce the bloating. If you had a lower endoscopy (such as a colonoscopy or flexible sigmoidoscopy) you may notice spotting of blood in your stool or on the toilet paper. If you underwent a bowel prep for your procedure, you may not have a normal bowel movement for a few days.  Please Note:  You might notice some irritation and congestion in your nose or some drainage.  This is from the oxygen used during your procedure.  There is no need for concern and it should clear up in a day or so.  SYMPTOMS TO REPORT IMMEDIATELY:   Following lower endoscopy (colonoscopy or flexible sigmoidoscopy):  Excessive amounts of blood in the stool  Significant tenderness or worsening of abdominal pains  Swelling of the abdomen that is new, acute  Fever of 100F or higher   For urgent or emergent issues, a gastroenterologist can be reached at any hour by calling [phone]. Do not use MyChart messaging for urgent concerns.    DIET:  We do recommend a small meal at first, but then you may proceed to your regular diet.  Drink plenty of fluids but you should avoid alcoholic beverages for 24 hours.  ACTIVITY:  You should plan to take it easy for the rest of today and you should NOT DRIVE or use heavy machinery until tomorrow (because  of the sedation medicines used during the test).    FOLLOW UP: Our staff will call the number listed on your records 48-72 hours following your procedure to check on you and address any questions or concerns that you may have regarding the information given to you following your procedure. If we do not reach you, we will leave a message.  We will attempt to reach you two times.  During this call, we will ask if you have developed any symptoms of COVID 19. If you develop any symptoms (ie: fever, flu-like symptoms, shortness of breath, cough etc.) before then, please call [phone].  If you test positive for Covid 19 in the 2 weeks post procedure, please call and report this information to [place].    If any biopsies were taken you will be contacted by phone or by letter within the next 1-3 weeks.  Please call us at [phone] if you have not heard about the biopsies in 3 weeks.    SIGNATURES/CONFIDENTIALITY: You and/or your care partner have signed paperwork which will be entered into your electronic medical record.  These signatures attest to the fact that that the information above on your After Visit Summary has been reviewed and is understood.  Full responsibility of the confidentiality of this discharge information lies with you and/or your care-partner.  Resume medications. Information given on polyps,divericulosis and hemorrhoids.

## 2020-05-18 NOTE — Op Note (Signed)
[facility] Patient Name: Diana Scott Procedure Date: 05/18/2020 11:18 AM MRN: [id number] Endoscopist: Jackquline Denmark , MD Age: 64 Referring MD:  Date of Birth: 1956/04/25 Gender: Female Account #: [id number] Procedure:                Colonoscopy Indications:              High risk colon cancer surveillance: Personal                            history of colonic polyps Medicines:                Monitored Anesthesia Care Procedure:                Pre-Anesthesia Assessment:                           - Prior to the procedure, a History and Physical                            was performed, and patient medications and                            allergies were reviewed. The patient's tolerance of                            previous anesthesia was also reviewed. The risks                            and benefits of the procedure and the sedation                            options and risks were discussed with the patient.                            All questions were answered, and informed consent                            was obtained. Prior Anticoagulants: The patient has                            taken no previous anticoagulant or antiplatelet                            agents. ASA Grade Assessment: II - A patient with                            mild systemic disease. After reviewing the risks                            and benefits, the patient was deemed in                            satisfactory condition to undergo the procedure.  After obtaining informed consent, the colonoscope                            was passed under direct vision. Throughout the                            procedure, the patient's blood pressure, pulse, and                            oxygen saturations were monitored continuously. The                            Colonoscope was introduced through the anus and                            advanced to the 2 cm into the ileum. The                             colonoscopy was performed without difficulty. The                            patient tolerated the procedure well. The quality                            of the bowel preparation was adequate to identify                            polyps. The terminal ileum, ileocecal valve,                            appendiceal orifice, and rectum were photographed. Scope In: 11:25:56 AM Scope Out: 11:38:21 AM Scope Withdrawal Time: 0 hours 9 minutes 52 seconds  Total Procedure Duration: 0 hours 12 minutes 25 seconds  Findings:                 Two sessile polyps were found in the proximal                            descending colon and mid descending colon. The                            polyps were 4 to 6 mm in size. These polyps were                            removed with a cold snare. Resection and retrieval                            were complete.                           A few small-mouthed diverticula were found in the                            sigmoid colon, ascending colon  and cecum.                           Non-bleeding internal hemorrhoids were found during                            retroflexion. The hemorrhoids were small.                           The terminal ileum appeared normal.                           The exam was otherwise without abnormality on                            direct and retroflexion views. Complications:            No immediate complications. Estimated Blood Loss:     Estimated blood loss: none. Impression:               - Two 4 to 6 mm polyps in the proximal descending                            colon and in the mid descending colon, removed with                            a cold snare. Resected and retrieved.                           - Mild Diverticulosis in the sigmoid colon, in the                            ascending colon and in the cecum.                           - Non-bleeding internal hemorrhoids.                           - The  examined portion of the ileum was normal.                           - The examination was otherwise normal on direct                            and retroflexion views. Recommendation:           - Patient has a contact number available for                            emergencies. The signs and symptoms of potential                            delayed complications were discussed with the                            patient. Return to normal activities tomorrow.  Written discharge instructions were provided to the                            patient.                           - Resume previous diet.                           - Continue present medications.                           - Await pathology results.                           - Repeat colonoscopy for surveillance based on                            pathology results.                           - The findings and recommendations were discussed                            with the patient's family. Jackquline Denmark, MD 05/18/2020 11:42:19 AM This report has been signed electronically.

## 2020-05-20 ENCOUNTER — Telehealth: Payer: Self-pay

## 2020-05-20 ENCOUNTER — Telehealth: Payer: Self-pay | Admitting: *Deleted

## 2020-05-20 ENCOUNTER — Encounter: Payer: Self-pay | Admitting: Gastroenterology

## 2020-05-20 NOTE — Telephone Encounter (Signed)
Follow up call made. 

## 2020-05-20 NOTE — Telephone Encounter (Signed)
  Follow up Call-  Call back number 05/18/2020  Post procedure Call Back phone  # [phone]  Permission to leave phone message Yes  Some recent data might be hidden     Patient questions:  Do you have a fever, pain , or abdominal swelling? No. Pain Score  0 *  Have you tolerated food without any problems? Yes.    Have you been able to return to your normal activities? Yes.    Do you have any questions about your discharge instructions: Diet   No. Medications  No. Follow up visit  No.  Do you have questions or concerns about your Care? No.  Actions: * If pain score is 4 or above: No action needed, pain <4. 1. Have you developed a fever since your procedure? no  2.   Have you had an respiratory symptoms (SOB or cough) since your procedure? no  3.   Have you tested positive for COVID 19 since your procedure no  4.   Have you had any family members/close contacts diagnosed with the COVID 19 since your procedure?  no   If yes to any of these questions please route to Joylene John, RN and Erenest Rasher, RN

## 2021-06-15 ENCOUNTER — Emergency Department (HOSPITAL_COMMUNITY): Payer: PRIVATE HEALTH INSURANCE

## 2021-06-15 ENCOUNTER — Other Ambulatory Visit: Payer: Self-pay

## 2021-06-15 ENCOUNTER — Emergency Department (HOSPITAL_COMMUNITY)
Admission: EM | Admit: 2021-06-15 | Discharge: 2021-06-15 | Disposition: A | Discharge status: Home / Self Care | Payer: PRIVATE HEALTH INSURANCE | Attending: Emergency Medicine | Admitting: Emergency Medicine

## 2021-06-15 DIAGNOSIS — W01198A Fall on same level from slipping, tripping and stumbling with subsequent striking against other object, initial encounter: Secondary | ICD-10-CM | POA: Insufficient documentation

## 2021-06-15 DIAGNOSIS — Y990 Civilian activity done for income or pay: Secondary | ICD-10-CM | POA: Diagnosis not present

## 2021-06-15 DIAGNOSIS — Z91040 Latex allergy status: Secondary | ICD-10-CM | POA: Diagnosis not present

## 2021-06-15 DIAGNOSIS — Y9289 Other specified places as the place of occurrence of the external cause: Secondary | ICD-10-CM | POA: Diagnosis not present

## 2021-06-15 DIAGNOSIS — W19XXXA Unspecified fall, initial encounter: Secondary | ICD-10-CM

## 2021-06-15 DIAGNOSIS — Y9301 Activity, walking, marching and hiking: Secondary | ICD-10-CM | POA: Diagnosis not present

## 2021-06-15 DIAGNOSIS — J45909 Unspecified asthma, uncomplicated: Secondary | ICD-10-CM | POA: Diagnosis not present

## 2021-06-15 DIAGNOSIS — Z96652 Presence of left artificial knee joint: Secondary | ICD-10-CM | POA: Insufficient documentation

## 2021-06-15 DIAGNOSIS — R519 Headache, unspecified: Secondary | ICD-10-CM | POA: Diagnosis not present

## 2021-06-15 DIAGNOSIS — S80911A Unspecified superficial injury of right knee, initial encounter: Secondary | ICD-10-CM | POA: Insufficient documentation

## 2021-06-15 MED ORDER — KETOROLAC TROMETHAMINE 30 MG/ML IJ SOLN
30.0000 mg | Freq: Once | INTRAMUSCULAR | Status: AC
  Administered 2021-06-15: 30 mg via INTRAMUSCULAR
  Filled 2021-06-15: qty 1

## 2021-06-15 MED ORDER — LIDOCAINE 5 % EX PTCH: 1.0000 | MEDICATED_PATCH | CUTANEOUS | 0 refills | Status: AC

## 2021-06-15 MED ORDER — NAPROXEN 500 MG PO TABS: 500.0000 mg | ORAL_TABLET | Freq: Two times a day (BID) | ORAL | 0 refills | Status: AC

## 2021-06-15 NOTE — ED Provider Notes (Signed)
Emergency Medicine Provider Triage Evaluation Note  Diana Scott , a 65 y.o. female  was evaluated in triage.  Pt complains of mechanical fall and head trauma at work. She is c/o right knee pain  as well.  Review of Systems  Positive: Head injury, right knee pain Negative: No loc  Physical Exam  BP (!) 173/112 (BP Location: Right Arm)   Pulse 72   Temp 98.9 F (37.2 C) (Oral)   Resp 16   SpO2 98%  Gen:   Awake, no distress   Resp:  Normal effort  MSK:   Moves extremities without difficulty  Other:  Clear speech, no facial droop, strength and sensation intact throughout  Medical Decision Making  Medically screening exam initiated at 2:07 PM.  Appropriate orders placed.  SHATIKA PAPROCKI was informed that the remainder of the evaluation will be completed by another provider, this initial triage assessment does not replace that evaluation, and the importance of remaining in the ED until their evaluation is complete.     Rodney Booze, PA-C 06/15/21 1408    Truddie Hidden, MD 06/15/21 [phone]

## 2021-06-15 NOTE — ED Provider Notes (Signed)
[facility] EMERGENCY DEPARTMENT Provider Note   CSN: [id number] Arrival date & time: 06/15/21  1349     History Chief Complaint  Patient presents with   Diana Scott    Diana Scott is a 65 y.o. female.   Fall Associated symptoms include headaches.   Patient presents with pain to the right knee and head.  Pain started acutely when she was tripped while walking at work and fell forward hitting the front of her head against the wall.  She is unsure whether she tripped or if her right knee gave out on her.  The pain is constant, worse with ambulation.  There is no associated nausea, vomiting, vision changes.  Patient is not on any blood thinners.  Patient has had a previous total knee replacement on the left knee, not the right.  Has not tried any alleviating factors, walking is a worsening factor.  Past Medical History:  Diagnosis Date   Anemia    Asthma    childhood    Complication of anesthesia    sensitive to anesthesia; vertigo episode after anesthesia; nausea and vomiting    GERD (gastroesophageal reflux disease)    diet dependant   History of blood transfusion    History of bronchitis    Hyperlipidemia    PONV (postoperative nausea and vomiting)    Rectal bleeding    Thyroid disease    Vertigo     Patient Active Problem List   Diagnosis Date Noted   OA (osteoarthritis) of knee 06/29/2016    Past Surgical History:  Procedure Laterality Date   CHOLECYSTECTOMY     colonscopy      polyp removed    TONSILLECTOMY     TOTAL KNEE ARTHROPLASTY Left 06/29/2016   Procedure: LEFT TOTAL KNEE ARTHROPLASTY;  Surgeon: Gaynelle Arabian, MD;  Location: [facility];  Service: Orthopedics;  Laterality: Left;  Spinal to General (Failed Spinal)     OB History   No obstetric history on file.     No family history on file.  Social History   Tobacco Use   Smoking status: Never   Smokeless tobacco: Never  Substance Use Topics   Alcohol use: No   Drug use: No     Home Medications Prior to Admission medications   Medication Sig Start Date End Date Taking? Authorizing Provider  lidocaine (LIDODERM) 5 % Place 1 patch onto the skin daily. Remove & Discard patch within 12 hours or as directed by MD 06/15/21  Yes Sherrill Raring, PA-C  naproxen (NAPROSYN) 500 MG tablet Take 1 tablet (500 mg total) by mouth 2 (two) times daily. 06/15/21  Yes Sherrill Raring, PA-C  calcium carbonate (OSCAL) 1500 (600 Ca) MG TABS tablet Take 1 tablet by mouth daily. 05/13/18   [provider]  CRESTOR 10 MG tablet Take 5 mg by mouth See admin instructions. Pt takes for 3 days and 3 days off. This is to help tolerate med. 11/16/13   [provider]  levothyroxine (SYNTHROID) 100 MCG tablet Take 100 mcg by mouth daily.  11/16/13   [provider]  meloxicam (MOBIC) 15 MG tablet Take 1 tablet by mouth daily as needed. 03/02/20   [provider]  polyethylene glycol powder (GLYCOLAX/MIRALAX) 17 GM/SCOOP powder Take 1 Container by mouth once.    [provider]  Sod Picosulfate-Mag Ox-Cit Acd (CLENPIQ) 10-3.5-12 MG-GM -GM/160ML SOLN Take 1 kit by mouth as directed. Patient not taking: Reported on 05/18/2020 05/06/20   Diana Denmark,  MD    Allergies    Ezetimibe, Fenofibrate, Oxycodone, Codeine, Grapefruit concentrate, Latex, Lisinopril, and Aspirin  Review of Systems   Review of Systems  Constitutional:  Negative for fatigue and fever.  Musculoskeletal:  Positive for arthralgias and myalgias.  Skin:  Negative for color change and wound.  Neurological:  Positive for headaches. Negative for syncope and light-headedness.   Physical Exam Updated Vital Signs BP (!) 179/78 (BP Location: Left Arm)   Pulse 61   Temp 98.9 F (37.2 C) (Oral)   Resp 16   Ht _0  (1.549 m)   Wt 87.5 kg   SpO2 98%   BMI 36.47 kg/m   Physical Exam Vitals and nursing note reviewed. Exam conducted with a chaperone present.  Constitutional:      General: She is not  in acute distress.    Appearance: Normal appearance.  HENT:     Head: Normocephalic.     Comments: No scalp laceration or contusion to face.  No septal hematoma. Eyes:     General: No scleral icterus.    Extraocular Movements: Extraocular movements intact.     Pupils: Pupils are equal, round, and reactive to light.  Cardiovascular:     Comments: DP and PT are 2+ bilaterally Musculoskeletal:        General: Tenderness and signs of injury present. No deformity.     Comments: Patient has decreased flexion and extension of the right knee.  She also has tenderness along the patella area where she feels internally.  There is no crepitus or laxity.  Skin:    Coloration: Skin is not jaundiced.  Neurological:     Mental Status: She is alert. Mental status is at baseline.     Coordination: Coordination normal.     Comments: Patient is answering questions properly, no dysarthria.  Cranial nerves III through XII grossly intact.    ED Results / Procedures / Treatments   Labs (all labs ordered are listed, but only abnormal results are displayed) Labs Reviewed - No data to display  EKG None  Radiology CT HEAD WO CONTRAST (5MM)  Result Date: 06/15/2021 CLINICAL DATA:  Status post trauma. EXAM: CT HEAD WITHOUT CONTRAST TECHNIQUE: Contiguous axial images were obtained from the base of the skull through the vertex without intravenous contrast. COMPARISON:  February 01, 2013 FINDINGS: Brain: There is mild cerebral atrophy with widening of the extra-axial spaces and ventricular dilatation. There are areas of decreased attenuation within the white matter tracts of the supratentorial brain, consistent with microvascular disease changes. Mild, bilateral basal ganglia calcification is noted. Vascular: No hyperdense vessel or unexpected calcification. Skull: Normal. Negative for fracture or focal lesion. Sinuses/Orbits: No acute finding. Other: None. IMPRESSION: 1. Mild cerebral atrophy. 2. No acute intracranial  abnormality. Electronically Signed   By: Virgina Norfolk M.D.   On: 06/15/2021 18:53   DG Knee Complete 4 Views Right  Result Date: 06/15/2021 CLINICAL DATA:  Pain, fall EXAM: RIGHT KNEE - COMPLETE 4+ VIEW COMPARISON:  None. FINDINGS: There is no acute fracture or dislocation. There is no significant degenerative change. There is no significant joint effusion. IMPRESSION: Negative right knee radiographs. Electronically Signed   By: Maurine Simmering M.D.   On: 06/15/2021 15:49    Procedures Procedures   Medications Ordered in ED Medications  ketorolac (TORADOL) 30 MG/ML injection 30 mg (has no administration in time range)    ED Course  I have reviewed the triage vital signs and the nursing notes.  Pertinent  labs & imaging results that were available during my care of the patient were reviewed by me and considered in my medical decision making (see chart for details).    MDM Rules/Calculators/A&P                           Patient is hypertensive, likely due to pain.  Otherwise her vitals are stable.  She is ambulatory with difficulty due to pain in the right knee.  There were no focal deficits on neuro exam, CT head is negative for any intracranial bleed or fracture.  Additionally her face is atraumatic without any contusions or active bleeding sites.  Radiograph of the right knee does not show any signs dislocation or fracture.  She is neurovascularly intact to the left lower extremity.  Not consistent with compartment syndrome.    I will give her some pain medicine, apply knee immobilizer and give orthopedic follow-up.  It is possible the patient may have injured a ligament or one of the muscles, advised her to follow-up with orthopedic if there is no improvement in the next 5 days.  Return precautions given, patient verbalized understanding.  Final Clinical Impression(s) / ED Diagnoses Final diagnoses:  Fall, initial encounter    Rx / DC Orders ED Discharge Orders           Ordered    naproxen (NAPROSYN) 500 MG tablet  2 times daily        06/15/21 2233    lidocaine (LIDODERM) 5 %  Every 24 hours        06/15/21 2233             Sherrill Raring, PA-C 06/15/21 2241    Blanchie Dessert, MD 06/16/21 [phone]

## 2021-06-15 NOTE — Discharge Instructions (Addendum)
If Lidoderm patch helped here in the ED please continue to use the patches once every 24 hours as needed.    Take naproxen twice daily with food and water for the next 5 days.    Continue to wear the knee immobilizer when you are ambulating, you may take it off when you shower and sleep.  I also advised using the crutches you have at home.    If there is no improvement in the next week or things worsen I would recommend following up with orthopedic surgery.    You may also follow-up with your primary care doctor if that is easier to evaluate whether or not MRI of the knee is needed for further evaluation.  If things change or worsen return back to the ED.

## 2021-06-15 NOTE — ED Triage Notes (Signed)
Pt here POV with c/o of fall. Pt was walking at work when her knee gave out and she fell hitting head on wall. Pt endorses knee pain. No LOC. Pt states head pain 9/10

## 2022-01-03 ENCOUNTER — Ambulatory Visit (INDEPENDENT_AMBULATORY_CARE_PROVIDER_SITE_OTHER): Payer: BC Managed Care – PPO

## 2022-01-03 ENCOUNTER — Ambulatory Visit: Payer: BC Managed Care – PPO | Admitting: Podiatrist

## 2022-01-03 ENCOUNTER — Encounter: Payer: Self-pay | Admitting: Podiatrist

## 2022-01-03 ENCOUNTER — Other Ambulatory Visit: Payer: Self-pay

## 2022-01-03 DIAGNOSIS — M67471 Ganglion, right ankle and foot: Secondary | ICD-10-CM

## 2022-01-03 DIAGNOSIS — M79671 Pain in right foot: Secondary | ICD-10-CM | POA: Diagnosis not present

## 2022-01-03 DIAGNOSIS — M67472 Ganglion, left ankle and foot: Secondary | ICD-10-CM

## 2022-01-03 DIAGNOSIS — M79672 Pain in left foot: Secondary | ICD-10-CM

## 2022-01-03 MED ORDER — TRIAMCINOLONE ACETONIDE 10 MG/ML IJ SUSP
10.0000 mg | Freq: Once | INTRAMUSCULAR | Status: AC
Start: 2022-01-03 — End: 2022-01-03
  Administered 2022-01-03: 10 mg

## 2022-01-03 NOTE — Progress Notes (Signed)
Chief Complaint  ?Patient presents with  ? Cyst  ?  Top of both feet for 10+ years  ? ? ?  ? ?HPI: Patient is 66 y.o. female who presents today for a painful cyst on the top of her feet- right being more symptomatic than the left. She has had the cyst for several years and they are causing pain when she walks. 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  She has tried changing shoes so the tongue doesn't rub with little relief in symptoms. She has tried no other treatment besides changing shoes.   ? ?Patient Active Problem List  ? Diagnosis Date Noted  ? OA (osteoarthritis) of knee 06/29/2016  ? ? ?Current Outpatient Medications on File Prior to Visit  ?Medication Sig Dispense Refill  ? calcium carbonate (OSCAL) 1500 (600 Ca) MG TABS tablet Take 1 tablet by mouth daily.    ? CRESTOR 10 MG tablet Take 5 mg by mouth See admin instructions. Pt takes for 3 days and 3 days off. This is to help tolerate med.    ? levothyroxine (SYNTHROID) 100 MCG tablet Take 100 mcg by mouth daily.     ? lidocaine (LIDODERM) 5 % Place 1 patch onto the skin daily. Remove & Discard patch within 12 hours or as directed by MD 30 patch 0  ? meloxicam (MOBIC) 15 MG tablet Take 1 tablet by mouth daily as needed.    ? naproxen (NAPROSYN) 500 MG tablet Take 1 tablet (500 mg total) by mouth 2 (two) times daily. 30 tablet 0  ? polyethylene glycol powder (GLYCOLAX/MIRALAX) 17 GM/SCOOP powder Take 1 Container by mouth once.    ? Sod Picosulfate-Mag Ox-Cit Acd (CLENPIQ) 10-3.5-12 MG-GM -GM/160ML SOLN Take 1 kit by mouth as directed. (Patient not taking: Reported on 05/18/2020) 320 mL 0  ? ?No current facility-administered medications on file prior to visit.  ? ? ?Allergies  ?Allergen Reactions  ? Ezetimibe Anaphylaxis  ?  Headache  ? Fenofibrate Anaphylaxis  ? Oxycodone Nausea Only and Other (See Comments)  ? Codeine Other (See Comments)  ?  Reaction unknown Hydrocodone, Oxycodone  ?Reaction unknown Hydrocodone, Oxycodone   ? Grapefruit  Concentrate Other (See Comments)  ?  Reaction unknown  ? Latex Hives  ? Lisinopril Cough and Swelling  ? Aspirin Other (See Comments) and Palpitations  ?  Reaction unknown ?Reaction unknown  ? ? ?Review of Systems ?No fevers, chills, nausea, muscle aches, no difficulty breathing, no calf pain, no chest pain or shortness of breath. ? ? ?Physical Exam ? ?GENERAL APPEARANCE: Alert, conversant. Appropriately groomed. No acute distress.  ? ?VASCULAR: Pedal pulses palpable DP and PT bilateral.  Capillary refill time is immediate to all digits,  Proximal to distal cooling it warm to warm.  Digital perfusion adequate.  ? ?NEUROLOGIC: sensation is intact to 5.07 monofilament at 5/5 sites bilateral.  Light touch is intact bilateral, vibratory sensation intact bilateral ? ?MUSCULOSKELETAL: acceptable muscle strength, tone and stability bilateral.  No gross boney pedal deformities noted.  No pain, crepitus or limitation noted with foot and ankle range of motion bilateral.  ? ?DERMATOLOGIC: skin is warm, supple, and dry.  No open lesions noted.  No rash, no pre ulcerative lesions. Digital nails are asymptomatic.   ? ?Well circumscribed ganglion appearing cyst present on the dorsum of the right greater than left foot at the level of the cuneiforms.  The right cyst is painful with pressure the left is also  tender but less so than the right.   ? ?See photos: ? ? ? ? ? ?Xray:  3 views bilateral feet are obtained. No acute osseous abnormalities seen.  Osteophytic spurring noted at the articulation of the second and third metatarsal base and cuneiform articulation on both feet.   ? ? ? ?Assessment  ? ?  ICD-10-CM   ?1. Pain in both feet  M79.671 DG Foot Complete Right  ? P10.258 DG Foot Complete Left  ?  ?2. Ganglion cyst of right foot  M67.471   ?  ?3. Ganglion cyst of left foot  M67.472   ?  ? ? ? ?Plan ? ?Discussed exam findings and xrays with the patient.  Discussed they appear to be ganglion cysts.  I recommended trying an  injection of steroid to see if this will help with her pain- this was carried out today with 56m kenalog and marcaine plain under sterile technique and without complication on the cyst on the right foot.  ?I discussed if the pain and swelling returns, she likely will need to have the cysts removed and bone spur shaved down to prevent future recurrence.  I will have her see one of the surgeons to discuss her options surgically to see if she may benefit from a surgical excision.  If any questions arise in the meantime she will call.   ?

## 2022-01-03 NOTE — Patient Instructions (Signed)
Ganglion Cyst ?A ganglion cyst is a non-cancerous, fluid-filled lump of tissue that occurs near a joint, tendon, or ligament. The cyst grows out of a joint or the lining of a tendon or ligament. Ganglion cysts most often develop in the hand or wrist, but they can also develop in the shoulder, elbow, hip, knee, ankle, or foot. ?Ganglion cysts are ball-shaped or egg-shaped. Their size can range from the size of a pea to larger than a grape. Increased activity may cause the cyst to get bigger because more fluid starts to build up. ?What are the causes? ?The exact cause of this condition is not known, but it may be related to: ?Inflammation or irritation around the joint. ?An injury or tear in the layers of tissue around the joint (joint capsule). ?Repetitive movements or overuse. ?History of acute or repeated injury. ?What increases the risk? ?You are more likely to develop this condition if: ?You are a female. ?You are 83-29 years old. ?What are the signs or symptoms? ?The main symptom of this condition is a lump. It most often appears on the hand or wrist. In many cases, there are no other symptoms, but a cyst can sometimes cause: ?Tingling. ?Pain or tenderness. ?Numbness. ?Weakness or loss of strength in the affected joint. ?Decreased range of motion in the affected area of the body. ?How is this diagnosed? ?Ganglion cysts are usually diagnosed based on a physical exam. Your health care provider will feel the lump and may shine a light next to it. If it is a ganglion cyst, the light will likely shine through it. ?Your health care provider may order an X-ray, ultrasound, MRI, or CT scan to rule out other conditions. ?How is this treated? ?Ganglion cysts often go away on their own without treatment. If you have pain or other symptoms, treatment may be needed. Treatment is also needed if the ganglion cyst limits your movement or if it gets infected. Treatment may include: ?Wearing a brace or splint on your wrist or  finger. ?Taking anti-inflammatory medicine. ?Having fluid drained from the lump with a needle (aspiration). ?Getting an injection of medicine into the joint to decrease inflammation. This may be corticosteroids, ethanol, or hyaluronidase. ?Having surgery to remove the ganglion cyst. ?Placing a pad in your shoe or wearing shoes that will not rub against the cyst if it is on your foot. ?Follow these instructions at home: ?Do not press on the ganglion cyst, poke it with a needle, or hit it. ?Take over-the-counter and prescription medicines only as told by your health care provider. ?If you have a brace or splint: ?Wear it as told by your health care provider. ?Remove it as told by your health care provider. Ask if you need to remove it when you take a shower or a bath. ?Watch your ganglion cyst for any changes. ?Keep all follow-up visits as told by your health care provider. This is important. ?Contact a health care provider if: ?Your ganglion cyst becomes larger or more painful. ?You have pus coming from the lump. ?You have weakness or numbness in the affected area. ?You have a fever or chills. ?Get help right away if: ?You have a fever and have any of these in the cyst area: ?Increased redness. ?Red streaks. ?Swelling. ?Summary ?A ganglion cyst is a non-cancerous, fluid-filled lump that occurs near a joint, tendon, or ligament. ?Ganglion cysts most often develop in the hand or wrist, but they can also develop in the shoulder, elbow, hip, knee, ankle, or foot. ?  Ganglion cysts often go away on their own without treatment. ?This information is not intended to replace advice given to you by your health care provider. Make sure you discuss any questions you have with your health care provider. ?Document Revised: 12/25/2019 Document Reviewed: 12/25/2019 ?Elsevier Patient Education ? [place]. ? ?

## 2022-08-03 ENCOUNTER — Ambulatory Visit (INDEPENDENT_AMBULATORY_CARE_PROVIDER_SITE_OTHER): Payer: BC Managed Care – PPO | Admitting: Podiatry

## 2022-08-03 DIAGNOSIS — M67471 Ganglion, right ankle and foot: Secondary | ICD-10-CM | POA: Diagnosis not present

## 2022-08-03 NOTE — Progress Notes (Signed)
Chief Complaint  Patient presents with   surgery consultation    Patient is here for surgical consultation for ganglion cyst Bilateral, she has seen Dr. Valentina Lucks and discussed options.    HPI: 66 y.o. female presenting today for follow-up evaluation of symptomatic lesions/bumps to the dorsum of the bilateral feet right greater than the left.  She says that she has been diagnosed with ganglion cyst and they change in size.  She presents for further treatment and evaluation  Past Medical History:  Diagnosis Date   Anemia    Asthma    childhood    Complication of anesthesia    sensitive to anesthesia; vertigo episode after anesthesia; nausea and vomiting    GERD (gastroesophageal reflux disease)    diet dependant   History of blood transfusion    History of bronchitis    Hyperlipidemia    PONV (postoperative nausea and vomiting)    Rectal bleeding    Thyroid disease    Vertigo     Past Surgical History:  Procedure Laterality Date   CHOLECYSTECTOMY     colonscopy      polyp removed    TONSILLECTOMY     TOTAL KNEE ARTHROPLASTY Left 06/29/2016   Procedure: LEFT TOTAL KNEE ARTHROPLASTY;  Surgeon: Gaynelle Arabian, MD;  Location: [facility];  Service: Orthopedics;  Laterality: Left;  Spinal to General (Failed Spinal)    Allergies  Allergen Reactions   Ezetimibe Anaphylaxis    Headache   Fenofibrate Anaphylaxis   Oxycodone Nausea Only and Other (See Comments)   Codeine Other (See Comments)    Reaction unknown Hydrocodone, Oxycodone  Reaction unknown Hydrocodone, Oxycodone    Grapefruit Concentrate Other (See Comments)    Reaction unknown   Latex Hives   Lisinopril Cough and Swelling   Aspirin Other (See Comments) and Palpitations    Reaction unknown Reaction unknown     Physical Exam: General: The patient is alert and oriented x3 in no acute distress.  Dermatology: Skin is warm, dry and supple bilateral lower extremities. Negative for open lesions or  macerations.  Vascular: Palpable pedal pulses bilaterally. Capillary refill within normal limits.  Negative for any significant edema or erythema  Neurological: Light touch and protective threshold grossly intact  Musculoskeletal Exam: No pedal deformities noted.  There is tenderness to palpation along the dorsal bumps of the bilateral feet around the midtarsal joint.  Radiographic Exam B/L feet 01/03/2022:  Degenerative Changes noted to the bilateral midtarsal joints with dorsal spurring noted on bilateral lateral views.  Assessment: 1. DJD b/l midtarsals  2.  Exostosis of bone bilateral metatarsals.  RT > LT 3.  Overlying ganglion cyst bilateral   Plan of Care:  1. Patient evaluated. X-Rays reviewed.  2.  Today we discussed different treatment options for the patient both conservative and surgical.  The patient is suffering from mostly dorsal bump pain to the bilateral feet.  I do believe she would benefit from excision of the overlying ganglion cyst with simple exostectomy versus arthrodesis of the midfoot to address the arthritis.  Patient understands and agrees with this more simple procedure versus more invasive arthrodesis surgery 3.  Risk benefits advantages and disadvantages were all explained.  No guarantees were expressed or implied.  Postoperative recovery course was also explained in detail.   4.  Authorization for surgery was initiated today.  Surgery will consist of excision of ganglion cyst right.  Dorsal exostectomy right. 5.  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 Edrick Kins, DPM    [address], [place] [address]                Office [phone]  Fax [phone]

## 2022-08-15 ENCOUNTER — Telehealth: Payer: Self-pay

## 2022-08-16 NOTE — Telephone Encounter (Signed)
No further information needed from me.

## 2022-09-22 ENCOUNTER — Other Ambulatory Visit: Payer: Self-pay | Admitting: Family Medicine

## 2022-09-22 ENCOUNTER — Ambulatory Visit: Payer: Self-pay

## 2022-09-22 DIAGNOSIS — M545 Low back pain, unspecified: Secondary | ICD-10-CM

## 2022-12-03 IMAGING — CR DG KNEE COMPLETE 4+V*R*
4 series · 5 of 5 positions shown · non-contrast
Comparison: None.

CLINICAL DATA: Pain, fall

EXAM:
RIGHT KNEE - COMPLETE 4+ VIEW

[knee ap]
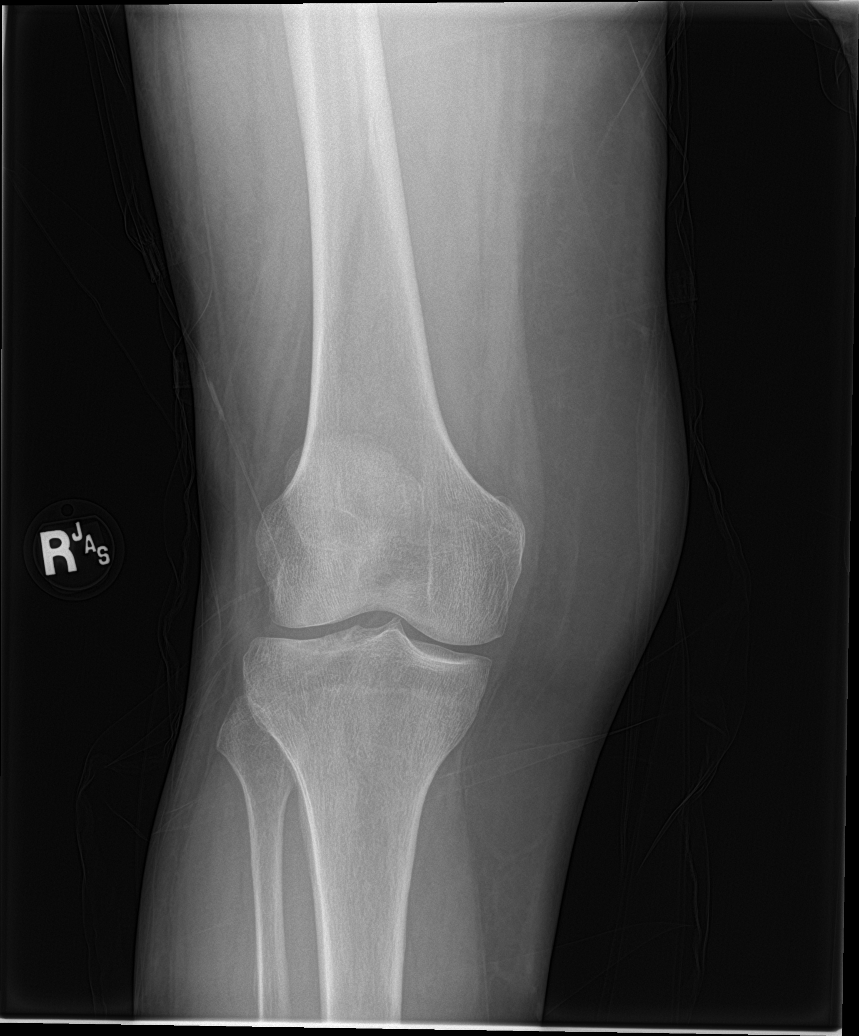

[Series 3: knee lat · 0.14mm/px · 2 of 2 slices shown]
[im 1/2]
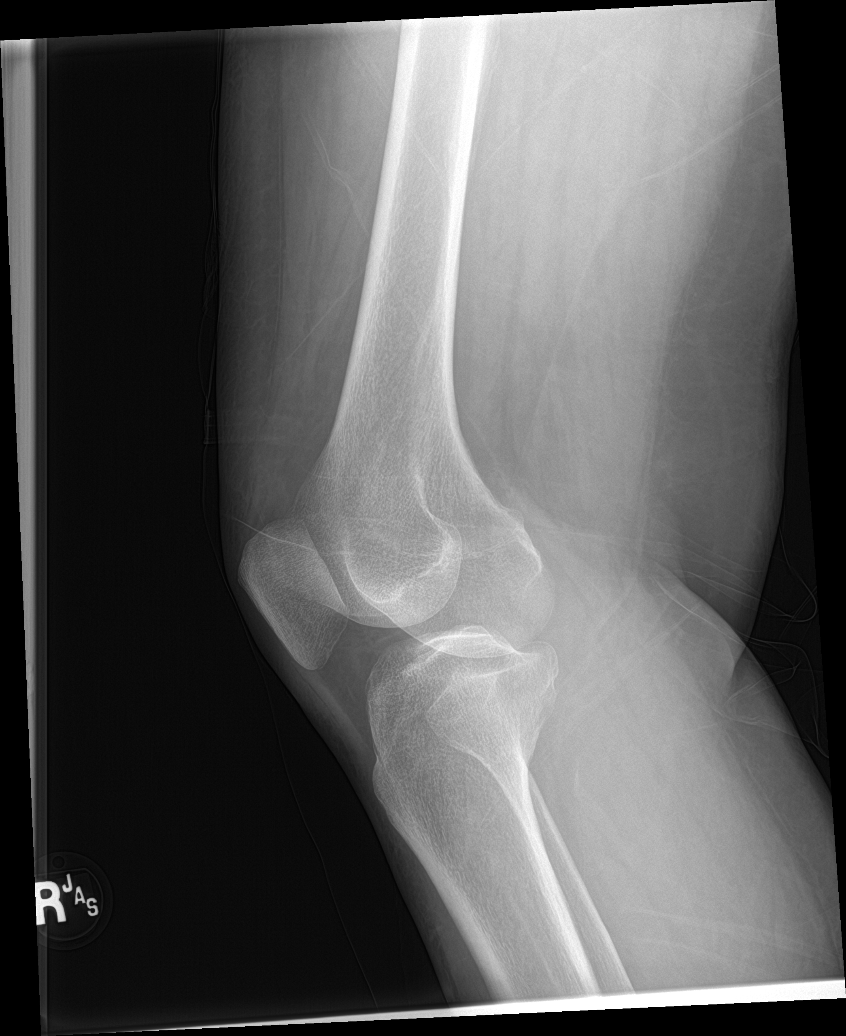
[im 2/2]
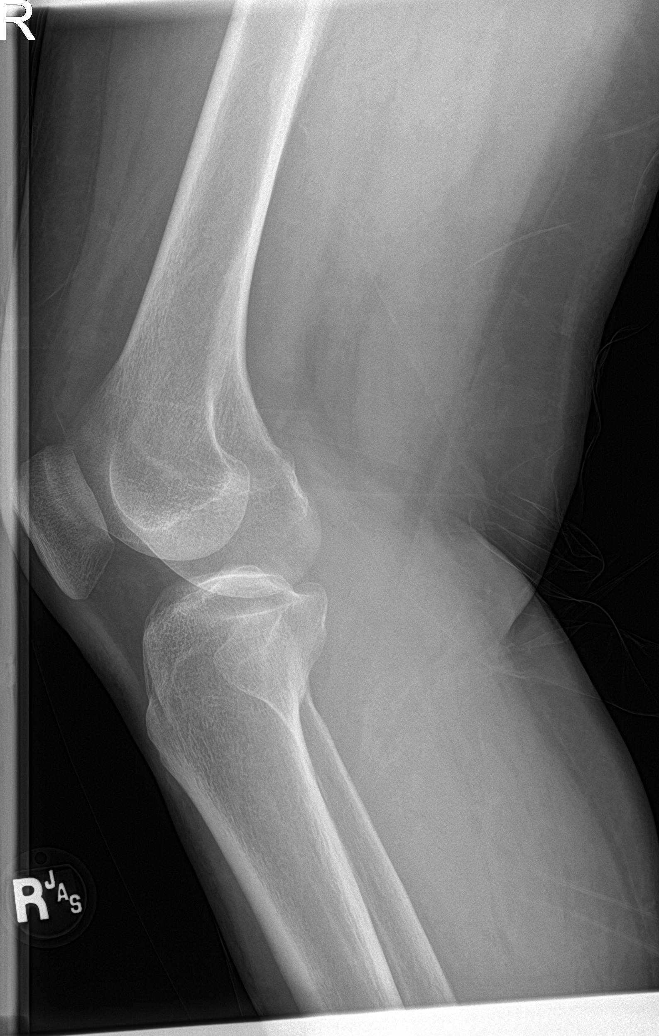

[knee obl (1 of 2)]
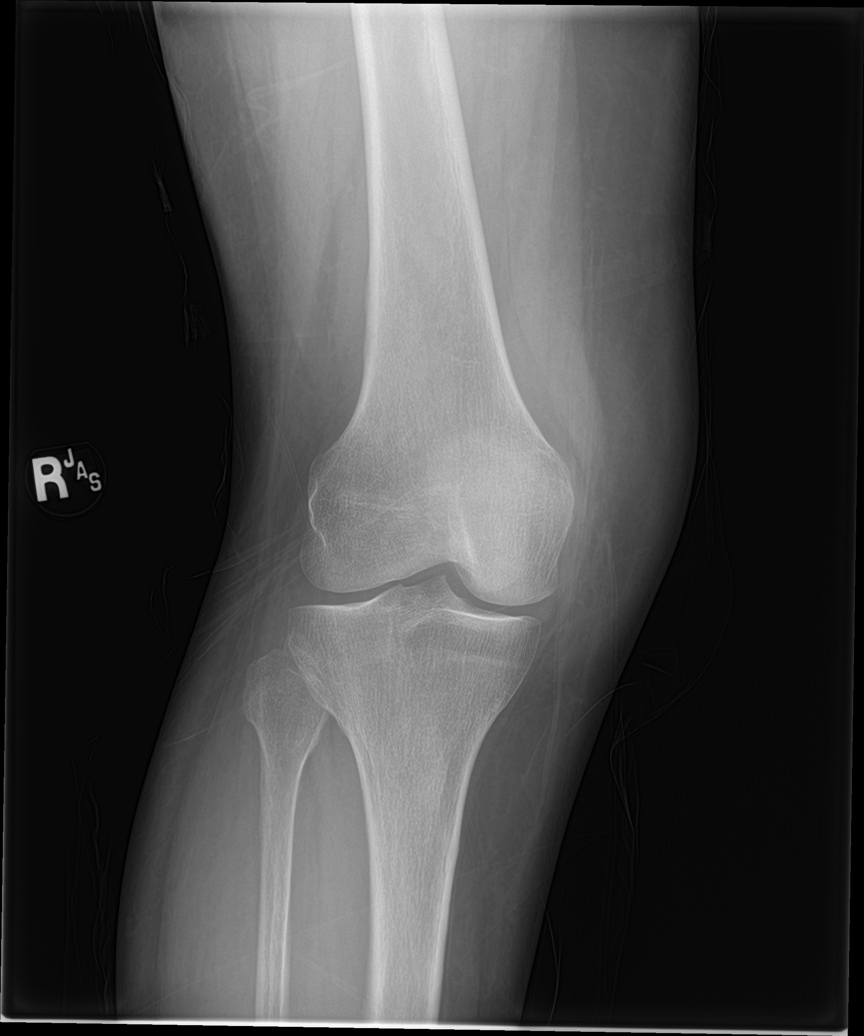

[knee obl (2 of 2)]
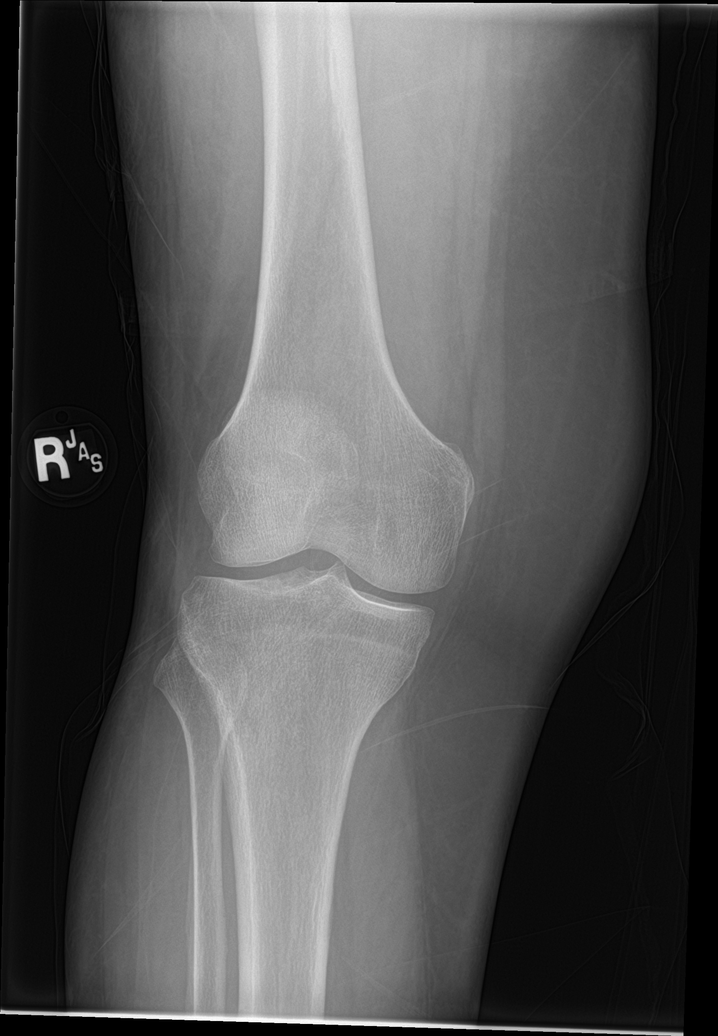

[5 of 5 positions shown; findings below may reference images not displayed]

FINDINGS: There is no acute fracture or dislocation. There is no significant
degenerative change. There is no significant joint effusion.
IMPRESSION: Negative right knee radiographs.
